# Patient Record
Sex: Male | Born: 1960 | Race: White | Hispanic: No | Smoking: Current every day smoker
Health system: Southern US, Community
[De-identification: ages and names within clinical notes are randomized; demographics above are authoritative.]

## PROBLEM LIST (undated history)

## (undated) DIAGNOSIS — N390 Urinary tract infection, site not specified: Secondary | ICD-10-CM

## (undated) DIAGNOSIS — J449 Chronic obstructive pulmonary disease, unspecified: Secondary | ICD-10-CM

## (undated) DIAGNOSIS — I10 Essential (primary) hypertension: Secondary | ICD-10-CM

## (undated) DIAGNOSIS — Q600 Renal agenesis, unilateral: Secondary | ICD-10-CM

## (undated) DIAGNOSIS — B9620 Unspecified Escherichia coli [E. coli] as the cause of diseases classified elsewhere: Secondary | ICD-10-CM

## (undated) DIAGNOSIS — A4150 Gram-negative sepsis, unspecified: Secondary | ICD-10-CM

## (undated) DIAGNOSIS — J40 Bronchitis, not specified as acute or chronic: Secondary | ICD-10-CM

## (undated) DIAGNOSIS — Z720 Tobacco use: Secondary | ICD-10-CM

## (undated) DIAGNOSIS — R7881 Bacteremia: Secondary | ICD-10-CM

## (undated) HISTORY — PX: INNER EAR SURGERY: SHX679

## (undated) HISTORY — PX: HAND SURGERY: SHX662

---

## 2005-01-26 ENCOUNTER — Emergency Department: Payer: Self-pay | Admitting: Emergency Medicine

## 2012-02-21 ENCOUNTER — Observation Stay: Payer: Self-pay | Admitting: Internal Medicine

## 2012-02-21 LAB — CBC WITH DIFFERENTIAL/PLATELET
Basophil #: 0.1 10*3/uL (ref 0.0–0.1)
Basophil %: 0.4 %
Eosinophil %: 0.2 %
HGB: 14.5 g/dL (ref 13.0–18.0)
Lymphocyte %: 6.2 %
MCH: 32 pg (ref 26.0–34.0)
MCHC: 33.3 g/dL (ref 32.0–36.0)
MCV: 96 fL (ref 80–100)
Monocyte #: 1.5 x10 3/mm — ABNORMAL HIGH (ref 0.2–1.0)
Monocyte %: 8.8 %
Platelet: 271 10*3/uL (ref 150–440)
RBC: 4.52 10*6/uL (ref 4.40–5.90)
RDW: 13.9 % (ref 11.5–14.5)
WBC: 17.2 10*3/uL — ABNORMAL HIGH (ref 3.8–10.6)

## 2012-02-21 LAB — CK TOTAL AND CKMB (NOT AT ARMC)
CK, Total: 130 U/L (ref 35–232)
CK-MB: 1.5 ng/mL (ref 0.5–3.6)
CK-MB: 0.8 ng/mL (ref 0.5–3.6)

## 2012-02-21 LAB — BASIC METABOLIC PANEL
BUN: 10 mg/dL (ref 7–18)
Calcium, Total: 8.7 mg/dL (ref 8.5–10.1)
EGFR (African American): >60
Glucose: 118 mg/dL — ABNORMAL HIGH (ref 65–99)
Osmolality: 278 (ref 275–301)
Potassium: 3.5 mmol/L (ref 3.5–5.1)

## 2012-02-21 LAB — TROPONIN I: Troponin-I: <0.02 ng/mL

## 2012-02-22 LAB — HEMOGLOBIN A1C: Hemoglobin A1C: 5.3 % (ref 4.2–6.3)

## 2012-02-22 LAB — LIPID PANEL
HDL Cholesterol: 46 mg/dL (ref 40–60)
Ldl Cholesterol, Calc: 71 mg/dL (ref 0–100)
Triglycerides: 54 mg/dL (ref 0–200)

## 2012-02-22 LAB — CK TOTAL AND CKMB (NOT AT ARMC): CK, Total: 97 U/L (ref 35–232)

## 2012-12-24 ENCOUNTER — Emergency Department: Payer: Self-pay | Admitting: Internal Medicine

## 2012-12-24 LAB — CBC WITH DIFFERENTIAL/PLATELET
Basophil #: 0 10*3/uL (ref 0.0–0.1)
Basophil %: 0.5 %
Eosinophil #: 0 10*3/uL (ref 0.0–0.7)
Eosinophil %: 0.2 %
HGB: 15.8 g/dL (ref 13.0–18.0)
Lymphocyte #: 1 10*3/uL (ref 1.0–3.6)
Lymphocyte %: 15.5 %
MCH: 32.4 pg (ref 26.0–34.0)
MCHC: 35 g/dL (ref 32.0–36.0)
Monocyte %: 19.3 %
Neutrophil #: 4.2 10*3/uL (ref 1.4–6.5)
RBC: 4.88 10*6/uL (ref 4.40–5.90)
RDW: 13.5 % (ref 11.5–14.5)

## 2012-12-24 LAB — URINALYSIS, COMPLETE
Bilirubin,UR: NEGATIVE
Ketone: NEGATIVE
Leukocyte Esterase: NEGATIVE
Nitrite: NEGATIVE
Ph: 5 (ref 4.5–8.0)
RBC,UR: 1 /HPF (ref 0–5)
Specific Gravity: 1.018 (ref 1.003–1.030)
Squamous Epithelial: <1
WBC UR: 5 /HPF (ref 0–5)

## 2012-12-24 LAB — BASIC METABOLIC PANEL
Anion Gap: 7 (ref 7–16)
BUN: 12 mg/dL (ref 7–18)
Calcium, Total: 9.4 mg/dL (ref 8.5–10.1)
Chloride: 103 mmol/L (ref 98–107)
EGFR (Non-African Amer.): >60
Glucose: 106 mg/dL — ABNORMAL HIGH (ref 65–99)
Potassium: 3.4 mmol/L — ABNORMAL LOW (ref 3.5–5.1)

## 2012-12-26 LAB — URINE CULTURE

## 2014-08-19 NOTE — H&P (Signed)
PATIENT NAME:  Willie White, Willie White MR#:  [id number] DATE OF BIRTH:  1961/03/17  DATE OF ADMISSION:  02/21/2012  PRIMARY CARE PHYSICIAN: None ER PHYSICIAN: Dr. Ulice Brilliant  CHIEF COMPLAINT: Chest pain.   HISTORY OF PRESENT ILLNESS: 54 year old male with history gastroesophageal reflux disease, takes Zantac occasionally with no other medical problems came in because he had chest pain this morning. Chest pain was in the middle of the chest, like  elephant sitting  on the chest. Patient did not have any shortness of breath. No nausea. No vomiting. Pain was around 8/10 in severity. Patient says that the pain was radiating to the left arm and relieved partly with nitroglycerin. Patient also says the pain is more when he takes deep breath. Denies cough or fever. No associated symptoms of nausea, vomiting or dizziness or sweating and no syncopal episodes.   PAST MEDICAL HISTORY: No hypertension. No diabetes. History of smoking.   ALLERGIES: Allergic to codeine.   SOCIAL HISTORY: Smokes 1 pack per day since smoking. Occasional alcohol. No drugs. Patient works as a Development worker, community.    PAST SURGICAL HISTORY: None.   FAMILY HISTORY: No history of coronary artery disease.   MEDICATIONS: Takes occasional Zantac.   REVIEW OF SYSTEMS: CONSTITUTIONAL: Patient denies any weakness or fatigue. HEENT: No headache. No bleeding from the nose or mouth. No trouble swallowing. CARDIOVASCULAR: Chest pain this morning. PULMONARY: Complains of pain more when he takes deep breath, but no cough, no shortness of breath on exertion. GASTROINTESTINAL: Has no nausea, no vomiting, no abdominal pain. GENITOURINARY: No dysuria. ENDOCRINE: Patient denies any diabetes or thyroid problems. NEUROLOGIC: No strokes. No transient ischemic attacks. No seizures. PSYCHIATRIC: Has no anxiety or insomnia.   PHYSICAL EXAMINATION:  VITAL SIGNS: Temperature 97.3, pulse 77, respirations 17, blood pressure 150/82, saturation 94% on room air.   GENERAL: Alert,  awake, oriented, not in distress.   HEENT: Head atraumatic, normocephalic. Pupils equally reacting to light. Extraocular movements intact.   ENT: No tympanic membrane congestion. No turbinate hypertrophy. No oropharyngeal erythema.   NECK: Normal range of motion. No JVD. No carotid bruit.   CARDIOVASCULAR: S1, S2 regular. No murmurs. PMI not displaced. Pulses are equal bilaterally in femoral and dorsalis pedis.    LUNGS: Clear to auscultation. Not using accessory muscles of respiration.   ABDOMEN: Soft, nontender, nondistended. No hernias. No organomegaly.   SKIN: Warm and dry.   EXTREMITIES: No extremity edema. No cyanosis. No clubbing.   NEUROLOGICAL: Cranial nerves II through XII intact. Power 5/5 in upper and lower extremities. Sensations are intact. Deep tendon reflexes 2+ bilaterally.   LABORATORY, DIAGNOSTIC AND RADIOLOGICAL DATA: Electrolytes: Sodium 113, potassium 3.5, chloride 105, bicarbonate 25, BUN 10, creatinine 0.85, glucose 118, troponin less than 0.02. D-dimer 0.35. WBC slightly elevated at 17.2, hemoglobin 14.5, hematocrit 43.4, platelets 279. CK total 153, CPK-MB 1.5.   Chest x-ray shows no definite evidence of pneumonia but cannot exclude perihilar subsegmental atelectasis. There is a foreign body in the AP window. EKG shows normal sinus rhythm with no ST-T changes.   ASSESSMENT AND PLAN:  1. Patient is a 54 year old male with chest pain relieved with nitroglycerin admitted to hospitalist services on overnight observation. Troponins have been negative. EKG no changes. So get a stress test tomorrow. Continue aspirin, beta blockers. Check fasting lipase. Continue the nitro paste and Lovenox. Patient also on small dose of beta blockers.  2. Possible bronchitis with leukocytosis and painful breathing. Patient is already started on Rocephin and Zithromax. Check the CBC  in the morning.  3. Tobacco abuse. Counseled against smoking. Patient does not want to quit at this time.  Offered nicotine patch 21 mg daily.   TIME SPENT ON HISTORY AND PHYSICAL: About 55 minutes.  ____________________________ Epifanio Lesches, MD sk:cms D: 02/21/2012 14:17:09 ET T: 02/21/2012 14:33:47 ET JOB#: [id number]  cc: Epifanio Lesches, MD, <Dictator> Epifanio Lesches MD ELECTRONICALLY SIGNED 03/13/2012 19:33

## 2014-08-19 NOTE — Discharge Summary (Signed)
PATIENT NAME:  Willie White, COUDRIET MR#:  [id number] DATE OF BIRTH:  06-May-1960  DATE OF ADMISSION:  02/21/2012 DATE OF DISCHARGE:  02/22/2012   DISCHARGE DIAGNOSES:  1. Noncardiac chest pain, likely secondary to bronchitis.  2. Gastroesophageal reflux disease. The patient is waiting for stress test and if results are negative will discharge the patient home.    PRIMARY CARE PHYSICIAN: None.   CONSULTATIONS: None.   HOSPITAL COURSE: The patient is a 54 year old male with no past medical history who came in because of chest pain in the middle of the chest as if an elephant was sitting on his chest. The patient also felt chest hurt more when he takes a deep breath. No cough. No fever. Look at the history and physical for full details. He was admitted for chest pain evaluation and started on nitro patch, aspirin, and small dose beta-blockers. His white count was elevated up to 17.2 on admission. He was started on Rocephin and Zithromax because of possible bronchitis and the fact that he had painful breathing. CK total 153, CPK-MB 1.5. Troponin was less than 0.02 x3. Chest x-ray showed no definite pneumonia, cannot exclude perihilar subsegmental atelectasis. The patient's LDL is 71, triglycerides 54. Hemoglobin A1c 5.3. The patient had good improvement with antibiotics. He says he is able to take deep breaths without now much pain in the chest and I wrote prescription for Augmentin for seven days and also if stress test is negative he can go home and use  nitro SL  as needed. The patient was advised to quit smoking. He is a smoker of one pack per day. He got nicotine patch here.   TIME SPENT ON DISCHARGE PREPARATION: Less than 30 minutes. He will go home if the stress test is negative.   ____________________________ Epifanio Lesches, MD sk:drc D: 02/22/2012 13:54:03 ET T: 02/22/2012 14:06:47 ET JOB#: [id number]  cc: Epifanio Lesches, MD, <Dictator> Epifanio Lesches MD ELECTRONICALLY SIGNED  03/13/2012 15:47

## 2017-03-03 ENCOUNTER — Emergency Department: Payer: Self-pay

## 2017-03-03 ENCOUNTER — Encounter: Payer: Self-pay | Admitting: Emergency Medicine

## 2017-03-03 ENCOUNTER — Emergency Department
Admission: EM | Admit: 2017-03-03 | Discharge: 2017-03-03 | Disposition: A | Discharge status: Home / Self Care | Payer: Self-pay | Attending: Emergency Medicine | Admitting: Emergency Medicine

## 2017-03-03 DIAGNOSIS — I10 Essential (primary) hypertension: Secondary | ICD-10-CM | POA: Insufficient documentation

## 2017-03-03 DIAGNOSIS — F17210 Nicotine dependence, cigarettes, uncomplicated: Secondary | ICD-10-CM | POA: Insufficient documentation

## 2017-03-03 DIAGNOSIS — J441 Chronic obstructive pulmonary disease with (acute) exacerbation: Secondary | ICD-10-CM | POA: Insufficient documentation

## 2017-03-03 HISTORY — DX: Essential (primary) hypertension: I10

## 2017-03-03 LAB — CBC
HEMATOCRIT: 42.3 % (ref 40.0–52.0)
HEMOGLOBIN: 14.4 g/dL (ref 13.0–18.0)
MCH: 32.6 pg (ref 26.0–34.0)
MCHC: 34.1 g/dL (ref 32.0–36.0)
MCV: 95.7 fL (ref 80.0–100.0)
Platelets: 211 10*3/uL (ref 150–440)
RBC: 4.41 MIL/uL (ref 4.40–5.90)
RDW: 14.3 % (ref 11.5–14.5)
WBC: 7.1 10*3/uL (ref 3.8–10.6)

## 2017-03-03 LAB — BASIC METABOLIC PANEL
ANION GAP: 8 (ref 5–15)
BUN: 14 mg/dL (ref 6–20)
CALCIUM: 9.6 mg/dL (ref 8.9–10.3)
CO2: 27 mmol/L (ref 22–32)
Chloride: 106 mmol/L (ref 101–111)
Creatinine, Ser: 0.91 mg/dL (ref 0.61–1.24)
GLUCOSE: 108 mg/dL — AB (ref 65–99)
POTASSIUM: 3.5 mmol/L (ref 3.5–5.1)
Sodium: 141 mmol/L (ref 135–145)

## 2017-03-03 LAB — TROPONIN I

## 2017-03-03 MED ORDER — PREDNISONE 20 MG PO TABS
ORAL_TABLET | ORAL | Status: AC
  Administered 2017-03-03: 60 mg via ORAL
  Filled 2017-03-03: qty 3

## 2017-03-03 MED ORDER — IPRATROPIUM-ALBUTEROL 0.5-2.5 (3) MG/3ML IN SOLN
RESPIRATORY_TRACT | Status: AC
  Administered 2017-03-03: 3 mL via RESPIRATORY_TRACT
  Filled 2017-03-03: qty 3

## 2017-03-03 MED ORDER — IPRATROPIUM-ALBUTEROL 0.5-2.5 (3) MG/3ML IN SOLN
3.0000 mL | Freq: Once | RESPIRATORY_TRACT | Status: AC
  Administered 2017-03-03: 3 mL via RESPIRATORY_TRACT
  Filled 2017-03-03 (×2): qty 3

## 2017-03-03 MED ORDER — ALBUTEROL SULFATE HFA 108 (90 BASE) MCG/ACT IN AERS: 2.0000 | INHALATION_SPRAY | Freq: Four times a day (QID) | RESPIRATORY_TRACT | 2 refills | Status: DC | PRN

## 2017-03-03 MED ORDER — IPRATROPIUM-ALBUTEROL 0.5-2.5 (3) MG/3ML IN SOLN
3.0000 mL | Freq: Once | RESPIRATORY_TRACT | Status: AC
  Administered 2017-03-03: 3 mL via RESPIRATORY_TRACT
  Filled 2017-03-03: qty 3

## 2017-03-03 MED ORDER — PREDNISONE 20 MG PO TABS
60.0000 mg | ORAL_TABLET | Freq: Once | ORAL | Status: AC
Start: 2017-03-03 — End: 2017-03-03
  Administered 2017-03-03: 60 mg via ORAL

## 2017-03-03 MED ORDER — PREDNISONE 20 MG PO TABS: 60.0000 mg | ORAL_TABLET | Freq: Every day | ORAL | 0 refills | Status: AC

## 2017-03-03 NOTE — Discharge Instructions (Signed)

## 2017-03-03 NOTE — ED Triage Notes (Signed)
Pt in via POV with complaints of generalized chest pain and shortness of breath x approximately one week.  Pt denies any other symptoms.  Pt reports being under a lot of stress at work, denies any hx of anxiety.  NAD noted at this time.

## 2017-03-03 NOTE — ED Provider Notes (Signed)
[facility] Emergency Department Provider Note  ____________________________________________  Time seen: Approximately 8:11 PM  I have reviewed the triage vital signs and the nursing notes.   HISTORY  Chief Complaint Chest Pain   HPI Willie White is a 56 y.o. male a history of smoking hypertension who presents for evaluation of chest pain.Patient reports that his symptom has been ongoing for about a week. He is complaining of chest tightness that is intermittent, diffuse across his chest and usually worse when he is working. 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 Also has had shortness of breath associated with it. Patient has a long history of smoking. Has not seen a physician in several years. No personal or family history of heart attacks or blood clots, no recent travel immobilization, no leg pain or swelling, no hemoptysis, no diaphoresis, no nausea or vomiting. No fever, no cough or congestion.   Past Medical History:  Diagnosis Date  . Hypertension     There are no active problems to display for this patient.   Past Surgical History:  Procedure Laterality Date  . HAND SURGERY Left   . INNER EAR SURGERY Left     Prior to Admission medications   Medication Sig Start Date End Date Taking? Authorizing Provider  albuterol (PROVENTIL HFA;VENTOLIN HFA) 108 (90 Base) MCG/ACT inhaler Inhale 2 puffs into the lungs every 6 (six) hours as needed for wheezing or shortness of breath. 03/03/17   Rudene Re, MD  predniSONE (DELTASONE) 20 MG tablet Take 3 tablets (60 mg total) by mouth daily. 03/03/17 03/07/17  Rudene Re, MD    Allergies Codeine  No family history on file.  Social History Social History  Substance Use Topics  . Smoking status: Current Every Day Smoker    Packs/day: 1.00    Types: Cigarettes  . Smokeless tobacco: Never Used  . Alcohol use Yes     Comment: Occassional     Review of  Systems  Constitutional: Negative for fever. Eyes: Negative for visual changes. ENT: Negative for sore throat. Neck: No neck pain  Cardiovascular: + Chest tightness  Respiratory: + shortness of breath. Gastrointestinal: Negative for abdominal pain, vomiting or diarrhea. Genitourinary: Negative for dysuria. Musculoskeletal: Negative for back pain. Skin: Negative for rash. Neurological: Negative for headaches, weakness or numbness. Psych: No SI or HI  ____________________________________________   PHYSICAL EXAM:  VITAL SIGNS: ED Triage Vitals [03/03/17 1805]  Enc Vitals Group     BP (!) 162/93     Pulse Rate 70     Resp 20     Temp 98.6 F (37 C)     Temp Source Oral     SpO2 97 %     Weight 160 lb (72.6 kg)     Height 5\' 8"  (1.727 m)     Head Circumference      Peak Flow      Pain Score 7     Pain Loc      Pain Edu?      Excl. in [place]?     Constitutional: Alert and oriented. Well appearing and in no apparent distress. HEENT:      Head: Normocephalic and atraumatic.         Eyes: Conjunctivae are normal. Sclera is non-icteric.       Mouth/Throat: Mucous membranes are moist.       Neck: Supple with no signs of meningismus. Cardiovascular: Regular rate and rhythm. No murmurs,  gallops, or rubs. 2+ symmetrical distal pulses are present in all extremities. No JVD. Respiratory: Normal respiratory effort. Severely diminished air movement with no wheezing or crackles  Gastrointestinal: Soft, non tender, and non distended with positive bowel sounds. No rebound or guarding. Musculoskeletal: Nontender with normal range of motion in all extremities. No edema, cyanosis, or erythema of extremities. Neurologic: Normal speech and language. Face is symmetric. Moving all extremities. No gross focal neurologic deficits are appreciated. Skin: Skin is warm, dry and intact. No rash noted. Psychiatric: Mood and affect are normal. Speech and behavior are  normal.  ____________________________________________   LABS (all labs ordered are listed, but only abnormal results are displayed)  Labs Reviewed  BASIC METABOLIC PANEL - Abnormal; Notable for the following:       Result Value   Glucose, Bld 108 (*)    All other components within normal limits  CBC  TROPONIN I  TROPONIN I   ____________________________________________  EKG  ED ECG REPORT I, Rudene Re, the attending physician, personally viewed and interpreted this ECG.  Normal sinus rhythm, rate of 75, LAFB, normal intervals, left axis deviation, no ST elevations or depressions. Unchanged from prior from 2013. ____________________________________________  RADIOLOGY  CXR:  No focal consolidation. Findings may represent reactive small airway disease versus viral infection. Clinical correlation is recommended. ____________________________________________   PROCEDURES  Procedure(s) performed: None Procedures Critical Care performed:  None ____________________________________________   INITIAL IMPRESSION / ASSESSMENT AND PLAN / ED COURSE  56 y.o. male a history of smoking hypertension who presents for evaluation of chest tightness and SOB x 1 week. Currently exposed to mold at work. History of smoking. Not seen a doctor in several years. Presentation concerning with COPD exacerbation with significantly reduced air movement. After 1 duoneb patient with increased aeration and now with faint wheezes and improvement of tightness. Will give another duoneb and prednisone. CXR with no PNA or infiltrate.     _________________________ 9:53 PM on 03/03/2017 -----------------------------------------  Patient's symptoms markedly improved after duonebs. Moving great air. Troponin x 2 negative. Patient will be dc home on albuterol inhaler, prednisone, referral to [facility] clinic for PCP. Dicussed harms of smoking and counseling provided. Discussed return precautions. Will dc  home at this time   As part of my medical decision making, I reviewed the following data within the [place] notes reviewed and incorporated, Labs reviewed , EKG interpreted , Radiograph reviewed  Notes from prior ED visits and [place] Controlled Substance Database    Pertinent labs & imaging results that were available during my care of the patient were reviewed by me and considered in my medical decision making (see chart for details).    ____________________________________________   FINAL CLINICAL IMPRESSION(S) / ED DIAGNOSES  Final diagnoses:  COPD exacerbation ([place])      NEW MEDICATIONS STARTED DURING THIS VISIT:  New Prescriptions   ALBUTEROL (PROVENTIL HFA;VENTOLIN HFA) 108 (90 BASE) MCG/ACT INHALER    Inhale 2 puffs into the lungs every 6 (six) hours as needed for wheezing or shortness of breath.   PREDNISONE (DELTASONE) 20 MG TABLET    Take 3 tablets (60 mg total) by mouth daily.     Note:  This document was prepared using Dragon voice recognition software and may include unintentional dictation errors.    Rudene Re, MD 03/03/17 2154

## 2018-05-02 ENCOUNTER — Encounter: Payer: Self-pay | Admitting: Emergency Medicine

## 2018-05-02 ENCOUNTER — Other Ambulatory Visit: Payer: Self-pay

## 2018-05-02 ENCOUNTER — Emergency Department: Payer: Self-pay

## 2018-05-02 ENCOUNTER — Emergency Department
Admission: EM | Admit: 2018-05-02 | Discharge: 2018-05-02 | Disposition: A | Discharge status: Home / Self Care | Payer: Self-pay | Attending: Emergency Medicine | Admitting: Emergency Medicine

## 2018-05-02 DIAGNOSIS — J441 Chronic obstructive pulmonary disease with (acute) exacerbation: Secondary | ICD-10-CM | POA: Insufficient documentation

## 2018-05-02 DIAGNOSIS — I10 Essential (primary) hypertension: Secondary | ICD-10-CM | POA: Insufficient documentation

## 2018-05-02 DIAGNOSIS — F17210 Nicotine dependence, cigarettes, uncomplicated: Secondary | ICD-10-CM | POA: Insufficient documentation

## 2018-05-02 LAB — CBC
HCT: 47.8 % (ref 39.0–52.0)
Hemoglobin: 15.8 g/dL (ref 13.0–17.0)
MCH: 31.9 pg (ref 26.0–34.0)
MCHC: 33.1 g/dL (ref 30.0–36.0)
MCV: 96.4 fL (ref 80.0–100.0)
PLATELETS: 219 10*3/uL (ref 150–400)
RBC: 4.96 MIL/uL (ref 4.22–5.81)
RDW: 13.7 % (ref 11.5–15.5)
WBC: 11.9 10*3/uL — AB (ref 4.0–10.5)
nRBC: 0 % (ref 0.0–0.2)

## 2018-05-02 LAB — BASIC METABOLIC PANEL
Anion gap: 8 (ref 5–15)
BUN: 18 mg/dL (ref 6–20)
CALCIUM: 9 mg/dL (ref 8.9–10.3)
CO2: 24 mmol/L (ref 22–32)
CREATININE: 1.08 mg/dL (ref 0.61–1.24)
Chloride: 107 mmol/L (ref 98–111)
GFR calc Af Amer: >60 mL/min (ref >60)
Glucose, Bld: 126 mg/dL — ABNORMAL HIGH (ref 70–99)
Potassium: 3.7 mmol/L (ref 3.5–5.1)
SODIUM: 139 mmol/L (ref 135–145)

## 2018-05-02 LAB — TROPONIN I

## 2018-05-02 MED ORDER — METHYLPREDNISOLONE SODIUM SUCC 125 MG IJ SOLR
125.0000 mg | Freq: Once | INTRAMUSCULAR | Status: AC
  Administered 2018-05-02: 125 mg via INTRAVENOUS
  Filled 2018-05-02: qty 2

## 2018-05-02 MED ORDER — IPRATROPIUM-ALBUTEROL 0.5-2.5 (3) MG/3ML IN SOLN
3.0000 mL | Freq: Once | RESPIRATORY_TRACT | Status: AC
  Administered 2018-05-02: 3 mL via RESPIRATORY_TRACT
  Filled 2018-05-02: qty 3

## 2018-05-02 MED ORDER — ALBUTEROL SULFATE HFA 108 (90 BASE) MCG/ACT IN AERS: 2.0000 | INHALATION_SPRAY | Freq: Four times a day (QID) | RESPIRATORY_TRACT | 2 refills | Status: DC | PRN

## 2018-05-02 MED ORDER — PREDNISONE 50 MG PO TABS: 50.0000 mg | ORAL_TABLET | Freq: Every day | ORAL | 0 refills | Status: DC

## 2018-05-02 NOTE — ED Triage Notes (Addendum)
Pt to ed with c/o sob x 4 days. Pt states " I just can't get a deep breath"  Pt reports heavy smoking for many years.  Reports hx of COPD.  Pt skin warm and dry.  Pt sats 97% on RA.

## 2018-05-02 NOTE — ED Provider Notes (Signed)
[facility] Emergency Department Provider Note   ____________________________________________    I have reviewed the triage vital signs and the nursing notes.   HISTORY  Chief Complaint Shortness of Breath     HPI Willie White is a 58 y.o. male who presents with complaints of shortness of breath.  Patient reports long history of smoking, he reports chest tightness, unable to get a full breath.  This is been worsening over the last 4 days.  Describes dry cough as well.  No fevers or chills.  No calf pain or swelling.  Has not taken thing for this.   Past Medical History:  Diagnosis Date  . Hypertension     There are no active problems to display for this patient.   Past Surgical History:  Procedure Laterality Date  . HAND SURGERY Left   . INNER EAR SURGERY Left     Prior to Admission medications   Medication Sig Start Date End Date Taking? Authorizing Provider  albuterol (PROVENTIL HFA;VENTOLIN HFA) 108 (90 Base) MCG/ACT inhaler Inhale 2 puffs into the lungs every 6 (six) hours as needed for wheezing or shortness of breath. 05/02/18   Lavonia Drafts, MD  predniSONE (DELTASONE) 50 MG tablet Take 1 tablet (50 mg total) by mouth daily with breakfast. 05/02/18   Lavonia Drafts, MD  ranitidine (ZANTAC) 150 MG tablet Take 150 mg by mouth 2 (two) times daily.    [provider]     Allergies Codeine  No family history on file.  Social History Social History   Tobacco Use  . Smoking status: Current Every Day Smoker    Packs/day: 1.00    Types: Cigarettes  . Smokeless tobacco: Never Used  Substance Use Topics  . Alcohol use: Yes    Comment: Occassional   . Drug use: No    Review of Systems  Constitutional: No fever/chills Eyes: No visual changes.  ENT: No sore throat. Cardiovascular: Denies chest pain. Respiratory: as above Gastrointestinal: No abdominal pain.  No nausea, no vomiting.   Genitourinary: Negative for  dysuria. Musculoskeletal: Negative for back pain. Skin: Negative for rash. Neurological: Negative for headaches or weakness   ____________________________________________   PHYSICAL EXAM:  VITAL SIGNS: ED Triage Vitals  Enc Vitals Group     BP 05/02/18 0953 (!) 158/108     Pulse Rate 05/02/18 0953 84     Resp 05/02/18 0953 18     Temp 05/02/18 0953 98.5 F (36.9 C)     Temp Source 05/02/18 0953 Oral     SpO2 05/02/18 0953 97 %     Weight 05/02/18 0954 72.6 kg (160 lb 0.9 oz)     Height --      Head Circumference --      Peak Flow --      Pain Score 05/02/18 0954 4     Pain Loc --      Pain Edu? --      Excl. in [place]? --     Constitutional: Alert and oriented. Eyes: Conjunctivae are normal.   Nose: No congestion/rhinnorhea. Mouth/Throat: Mucous membranes are moist.    Cardiovascular: Normal rate, regular rhythm. Grossly normal heart sounds.  Good peripheral circulation. Respiratory: Mild tachypnea, scattered wheezes, moderate airflow Gastrointestinal: Soft and nontender. No distention.   Musculoskeletal: No lower extremity tenderness nor edema.  Warm and well perfused Neurologic:  Normal speech and language. No gross focal neurologic deficits are appreciated.  Skin:  Skin is warm, dry and  intact. No rash noted. Psychiatric: Mood and affect are normal. Speech and behavior are normal.  ____________________________________________   LABS (all labs ordered are listed, but only abnormal results are displayed)  Labs Reviewed  BASIC METABOLIC PANEL - Abnormal; Notable for the following components:      Result Value   Glucose, Bld 126 (*)    All other components within normal limits  CBC - Abnormal; Notable for the following components:   WBC 11.9 (*)    All other components within normal limits  TROPONIN I   ____________________________________________  EKG  ED ECG REPORT I, Lavonia Drafts, the attending physician, personally viewed and interpreted this  ECG.  Date: 05/02/2018  Rhythm: normal sinus rhythm QRS Axis: normal Intervals: normal ST/T Wave abnormalities: normal Narrative Interpretation: no evidence of acute ischemia  ____________________________________________  RADIOLOGY  Chest x-ray normal ____________________________________________   PROCEDURES  Procedure(s) performed: No  Procedures   Critical Care performed: No ____________________________________________   INITIAL IMPRESSION / ASSESSMENT AND PLAN / ED COURSE  Pertinent labs & imaging results that were available during my care of the patient were reviewed by me and considered in my medical decision making (see chart for details).  Patient presents with shortness of breath, likely related to COPD exacerbation.  Treated with IV Solu-Medrol, multiple duo nebs.  X-ray negative for pneumonia, lab work reassuring.  On reexam patient moderately improved.  Recommended admission to the patient but he refused states that he has animals to take care of.  He knows he can return anytime.  Will treat with albuterol inhaler and prednisone    ____________________________________________   FINAL CLINICAL IMPRESSION(S) / ED DIAGNOSES  Final diagnoses:  COPD exacerbation ([place])        Note:  This document was prepared using Dragon voice recognition software and may include unintentional dictation errors.   Lavonia Drafts, MD 05/02/18 [phone]

## 2018-05-02 NOTE — ED Notes (Signed)
Pt in NAD at this time. Pt stating the breathing tx have helped. Nurse apologized for the wait. Nurse explained that the doctor should be seeing him momentarily since his results were back.

## 2018-05-02 NOTE — ED Notes (Signed)
Dr. Corky Downs at pt's bedside.

## 2019-09-02 ENCOUNTER — Emergency Department: Payer: Self-pay

## 2019-09-02 ENCOUNTER — Emergency Department
Admission: EM | Admit: 2019-09-02 | Discharge: 2019-09-02 | Disposition: A | Discharge status: Home / Self Care | Payer: Self-pay | Attending: Student in an Organized Health Care Education/Training Program | Admitting: Student in an Organized Health Care Education/Training Program

## 2019-09-02 ENCOUNTER — Encounter: Payer: Self-pay | Admitting: Emergency Medicine

## 2019-09-02 ENCOUNTER — Other Ambulatory Visit: Payer: Self-pay

## 2019-09-02 DIAGNOSIS — K047 Periapical abscess without sinus: Secondary | ICD-10-CM | POA: Insufficient documentation

## 2019-09-02 DIAGNOSIS — L03211 Cellulitis of face: Secondary | ICD-10-CM | POA: Insufficient documentation

## 2019-09-02 DIAGNOSIS — I10 Essential (primary) hypertension: Secondary | ICD-10-CM | POA: Insufficient documentation

## 2019-09-02 DIAGNOSIS — F17210 Nicotine dependence, cigarettes, uncomplicated: Secondary | ICD-10-CM | POA: Insufficient documentation

## 2019-09-02 LAB — CBC WITH DIFFERENTIAL/PLATELET
Abs Immature Granulocytes: 0.02 10*3/uL (ref 0.00–0.07)
Basophils Absolute: 0.1 10*3/uL (ref 0.0–0.1)
Basophils Relative: 1 %
Eosinophils Absolute: 0.3 10*3/uL (ref 0.0–0.5)
Eosinophils Relative: 3 %
HCT: 47.5 % (ref 39.0–52.0)
Hemoglobin: 16.5 g/dL (ref 13.0–17.0)
Immature Granulocytes: 0 %
Lymphocytes Relative: 14 %
Lymphs Abs: 1.1 10*3/uL (ref 0.7–4.0)
MCH: 34 pg (ref 26.0–34.0)
MCHC: 34.7 g/dL (ref 30.0–36.0)
MCV: 97.7 fL (ref 80.0–100.0)
Monocytes Absolute: 1.2 10*3/uL — ABNORMAL HIGH (ref 0.1–1.0)
Monocytes Relative: 14 %
Neutro Abs: 5.8 10*3/uL (ref 1.7–7.7)
Neutrophils Relative %: 68 %
Platelets: 174 10*3/uL (ref 150–400)
RBC: 4.86 MIL/uL (ref 4.22–5.81)
RDW: 13.2 % (ref 11.5–15.5)
WBC: 8.4 10*3/uL (ref 4.0–10.5)
nRBC: 0 % (ref 0.0–0.2)

## 2019-09-02 LAB — COMPREHENSIVE METABOLIC PANEL
ALT: 31 U/L (ref 0–44)
AST: 27 U/L (ref 15–41)
Albumin: 4.3 g/dL (ref 3.5–5.0)
Alkaline Phosphatase: 65 U/L (ref 38–126)
Anion gap: 9 (ref 5–15)
BUN: 12 mg/dL (ref 6–20)
CO2: 25 mmol/L (ref 22–32)
Calcium: 9.3 mg/dL (ref 8.9–10.3)
Chloride: 103 mmol/L (ref 98–111)
Creatinine, Ser: 0.91 mg/dL (ref 0.61–1.24)
GFR calc Af Amer: >60 mL/min (ref >60)
GFR calc non Af Amer: >60 mL/min (ref >60)
Glucose, Bld: 112 mg/dL — ABNORMAL HIGH (ref 70–99)
Potassium: 3.6 mmol/L (ref 3.5–5.1)
Sodium: 137 mmol/L (ref 135–145)
Total Bilirubin: 1.5 mg/dL — ABNORMAL HIGH (ref 0.3–1.2)
Total Protein: 8.4 g/dL — ABNORMAL HIGH (ref 6.5–8.1)

## 2019-09-02 MED ORDER — AMOXICILLIN 875 MG PO TABS
875.0000 mg | ORAL_TABLET | Freq: Two times a day (BID) | ORAL | 0 refills | Status: DC
Start: 2019-09-02 — End: 2019-09-02

## 2019-09-02 MED ORDER — CLINDAMYCIN HCL 150 MG PO CAPS
ORAL_CAPSULE | ORAL | 0 refills | Status: DC
Start: 2019-09-02 — End: 2021-12-16

## 2019-09-02 MED ORDER — SODIUM CHLORIDE 0.9 % IV SOLN
1.0000 g | Freq: Once | INTRAVENOUS | Status: AC
  Administered 2019-09-02: 1 g via INTRAVENOUS
  Filled 2019-09-02: qty 10

## 2019-09-02 MED ORDER — HYDROCODONE-ACETAMINOPHEN 5-325 MG PO TABS: 1.0000 | ORAL_TABLET | Freq: Four times a day (QID) | ORAL | 0 refills | Status: DC | PRN

## 2019-09-02 MED ORDER — IOHEXOL 300 MG/ML  SOLN
75.0000 mL | Freq: Once | INTRAMUSCULAR | Status: AC | PRN
  Administered 2019-09-02: 13:00:00 75 mL via INTRAVENOUS
  Filled 2019-09-02: qty 75

## 2019-09-02 MED ORDER — ALBUTEROL SULFATE HFA 108 (90 BASE) MCG/ACT IN AERS
2.0000 | INHALATION_SPRAY | Freq: Four times a day (QID) | RESPIRATORY_TRACT | 2 refills | Status: DC | PRN
Start: 2019-09-02 — End: 2021-12-16

## 2019-09-02 NOTE — ED Provider Notes (Signed)
[facility] Emergency Department Provider Note   ____________________________________________   First MD Initiated Contact with Patient 09/02/19 1113     (approximate)  I have reviewed the triage vital signs and the nursing notes.   HISTORY  Chief Complaint Facial Swelling   HPI Willie White is a 59 y.o. male presents to the ED with complaint of pain to the right side of his nose that started yesterday.  He states that this morning he woke with swelling around the lower aspect of his right eye and swelling to the right side of his face.  Patient denies any injury, insect bite, recent sinus infection or URI symptoms.  Patient is unaware of any fever denies chills.  There is no dental pain involved.  Currently he rates his pain as 7 out of 10.      Past Medical History:  Diagnosis Date  . Hypertension     There are no problems to display for this patient.   Past Surgical History:  Procedure Laterality Date  . HAND SURGERY Left   . INNER EAR SURGERY Left     Prior to Admission medications   Medication Sig Start Date End Date Taking? Authorizing Provider  albuterol (VENTOLIN HFA) 108 (90 Base) MCG/ACT inhaler Inhale 2 puffs into the lungs every 6 (six) hours as needed for wheezing or shortness of breath. 09/02/19   Johnn Hai, PA-C  clindamycin (CLEOCIN) 150 MG capsule 2 tabs tid x 7 days 09/02/19   Johnn Hai, PA-C  HYDROcodone-acetaminophen (NORCO/VICODIN) 5-325 MG tablet Take 1 tablet by mouth every 6 (six) hours as needed for moderate pain. 09/02/19   Johnn Hai, PA-C    Allergies Codeine  History reviewed. No pertinent family history.  Social History Social History   Tobacco Use  . Smoking status: Current Every Day Smoker    Packs/day: 1.00    Types: Cigarettes  . Smokeless tobacco: Never Used  Substance Use Topics  . Alcohol use: Yes    Comment: Occassional   . Drug use: No    Review of Systems Constitutional:  No fever/chills Eyes: No visual changes. ENT: No sore throat.  Negative for dental pain.  Positive for facial pain. Cardiovascular: Denies chest pain. Respiratory: Denies shortness of breath. Gastrointestinal: No abdominal pain.  No nausea, no vomiting.  Musculoskeletal: Negative for muscle aches. Skin: Erythema and swelling right side of face. Neurological: Negative for headaches, focal weakness or numbness. ____________________________________________   PHYSICAL EXAM:  VITAL SIGNS: ED Triage Vitals  Enc Vitals Group     BP 09/02/19 1058 (!) 156/104     Pulse Rate 09/02/19 1058 92     Resp 09/02/19 1058 20     Temp 09/02/19 1058 98.4 F (36.9 C)     Temp Source 09/02/19 1058 Oral     SpO2 09/02/19 1058 96 %     Weight 09/02/19 1059 190 lb (86.2 kg)     Height 09/02/19 1059 5\' 8"  (1.727 m)     Head Circumference --      Peak Flow --      Pain Score 09/02/19 1105 7     Pain Loc --      Pain Edu? --      Excl. in [place]? --    Constitutional: Alert and oriented. Well appearing and in no acute distress. Eyes: Conjunctivae are normal. PERRL. EOMI. right infraorbital area with soft tissue edema, erythema and tenderness is noted.  No discrete abscess  is palpated. Head: Atraumatic. Nose: No congestion/rhinnorhea.  Mouth/Throat: Mucous membranes are moist.  Oropharynx non-erythematous. Neck: No stridor.   Hematological/Lymphatic/Immunilogical: No cervical lymphadenopathy. Cardiovascular: Normal rate, regular rhythm. Grossly normal heart sounds.  Good peripheral circulation. Respiratory: Normal respiratory effort.  No retractions. Lungs CTAB. Musculoskeletal: Moves upper and lower extremities any difficulty.  Normal gait was noted. Neurologic:  Normal speech and language. No gross focal neurologic deficits are appreciated.  Skin:  Skin is warm, dry and intact. No rash noted. Psychiatric: Mood and affect are normal. Speech and behavior are  normal.  ____________________________________________   LABS (all labs ordered are listed, but only abnormal results are displayed)  Labs Reviewed  CBC WITH DIFFERENTIAL/PLATELET - Abnormal; Notable for the following components:      Result Value   Monocytes Absolute 1.2 (*)    All other components within normal limits  COMPREHENSIVE METABOLIC PANEL - Abnormal; Notable for the following components:   Glucose, Bld 112 (*)    Total Protein 8.4 (*)    Total Bilirubin 1.5 (*)    All other components within normal limits    RADIOLOGY   Official radiology report(s): CT Maxillofacial W Contrast  Result Date: 09/02/2019 CLINICAL DATA:  Right-sided infraorbital cellulitis with tenderness on palpation; mass, lump or swelling, max face. Additional history provided: Right maxillary and eye swelling since 9 p.m. last night, nasal and maxillary pain. EXAM: CT MAXILLOFACIAL WITH CONTRAST TECHNIQUE: Multidetector CT imaging of the maxillofacial structures was performed with intravenous contrast. Multiplanar CT image reconstructions were also generated. CONTRAST:  57mL OMNIPAQUE IOHEXOL 300 MG/ML  SOLN COMPARISON:  No pertinent prior studies available for comparison. FINDINGS: Osseous: Poor dentition with multiple absent, carious and fractured teeth. Most notably in the region of concern, there is periapical lucency surrounding the right maxillary canine and right maxillary first premolar. There may also be subtle periapical lucency surrounding the right maxillary second premolar. No maxillofacial fracture. There is subtle cortical breakthrough along the anterior margin along the right maxillary canine. 2 cm lucent osseous lesion within the lateral right orbital wall with central sclerosis, well circumscribed and favored benign (series 7, image 17). Orbits: Right periorbital inflammatory stranding as described. No CT evidence of postseptal orbital extension at this time. The globes are normal in size and  contour. The extraocular muscles are symmetric and unremarkable. Sinuses: There is opacification of the inferior frontal sinuses and frontal sinus drainage pathways. Mild frontal sinus mucosal thickening more superiorly. Moderate mucosal thickening within anterior ethmoid air cells bilaterally. Mild mucosal thickening within the sphenoid and right maxillary sinuses. No significant mastoid effusion. Soft tissues: There is inflammatory stranding within the right periorbital and right maxillofacial soft tissues, extending to the right perimandibular region. Adjacent to the right maxillary canine tooth, there is a small focus of soft tissue hypodensity measuring 0.5 x 1.1 x 1.4 cm (series 2, image 47) (series 6, image 20). Findings may reflect a small soft tissue abscess versus asymmetric fat. Limited intracranial: No acute abnormality identified. IMPRESSION: Findings consistent with right periorbital and maxillofacial cellulitis extending to the right perimandibular region. The source is not entirely clear as there is both dental and paranasal sinus disease. Most notably in the region of concern, there is periapical lucency surrounding the right maxillary canine and first premolar, as well as possible subtle periapical lucency surrounding the right maxillary second premolar. There is cortical loss anteriorly along the right maxillary canine. An adjacent 1.1 cm soft tissue abscess is questioned versus asymmetric pre-maxillary fat. No CT  evidence of post-septal orbital extension of cellulitis at this time. Electronically Signed   By: Kellie Simmering DO   On: 09/02/2019 13:34    ____________________________________________   PROCEDURES  Procedure(s) performed (including Critical Care):  Procedures   ____________________________________________   INITIAL IMPRESSION / ASSESSMENT AND PLAN / ED COURSE  As part of my medical decision making, I reviewed the following data within the electronic MEDICAL RECORD NUMBER  Notes from prior ED visits and [place] Controlled Substance Database  59 year old male presents to the ED with complaint of right facial swelling and pain over the last 12 hours.  Patient denies any injury to his face.  He is unaware of any fever, chills, nausea or vomiting.  He states that there is no dental pain and that his facial swelling has also decreased his vision in his right eye due to soft tissue edema.  Physical exam is concerning for a periorbital cellulitis.  Lab work was reassuring.  CT scan showed 2 dental abscesses and sinus disease.  Patient was given Rocephin 1 g IV while in the ED.  He was discharged with a prescription for clindamycin, Norco and albuterol inhaler.  Patient is to contact a dentist for his dental abscesses.  His return to the emergency department if any severe worsening of his symptoms or urgent concerns.  Patient is aware that he cannot drive or operate machinery while taking the pain medication.  ____________________________________________   FINAL CLINICAL IMPRESSION(S) / ED DIAGNOSES  Final diagnoses:  Facial cellulitis  Dental abscess     ED Discharge Orders         Ordered    albuterol (VENTOLIN HFA) 108 (90 Base) MCG/ACT inhaler  Every 6 hours PRN     09/02/19 1458    amoxicillin (AMOXIL) 875 MG tablet  2 times daily,   Status:  Discontinued     09/02/19 1458    HYDROcodone-acetaminophen (NORCO/VICODIN) 5-325 MG tablet  Every 6 hours PRN     09/02/19 1458    clindamycin (CLEOCIN) 150 MG capsule    Note to Pharmacy: Please discontinue the RX for Amoxil   09/02/19 1503           Note:  This document was prepared using Dragon voice recognition software and may include unintentional dictation errors.    Johnn Hai, PA-C 09/02/19 1604    Merlyn Lot, MD 09/02/19 [phone]

## 2019-09-02 NOTE — Discharge Instructions (Signed)
Follow-up with your primary care provider or can no clinic acute care if any continued problems.  You also need to follow-up with a dentist for your dental abscesses which is part of why her face is swollen.  Antibiotics were sent to your pharmacy.  Clindamycin 2 tablets 3 times a day for the next 7 days and Norco as needed for pain.  Also the albuterol inhaler prescription was renewed.  Return to the emergency department if any severe worsening of your symptoms such as fever, chills, nausea, vomiting or inability to take the antibiotic.

## 2019-09-02 NOTE — ED Triage Notes (Signed)
States he noticed some pain to right side of nose yesterday  Woke up with right eye and side of face swollen

## 2020-09-30 ENCOUNTER — Other Ambulatory Visit: Payer: Self-pay

## 2020-09-30 ENCOUNTER — Emergency Department: Payer: Self-pay

## 2020-09-30 ENCOUNTER — Encounter: Payer: Self-pay | Admitting: Emergency Medicine

## 2020-09-30 ENCOUNTER — Emergency Department
Admission: EM | Admit: 2020-09-30 | Discharge: 2020-09-30 | Disposition: A | Discharge status: Home / Self Care | Payer: Self-pay | Attending: Emergency Medicine | Admitting: Emergency Medicine

## 2020-09-30 DIAGNOSIS — M791 Myalgia, unspecified site: Secondary | ICD-10-CM | POA: Insufficient documentation

## 2020-09-30 DIAGNOSIS — J441 Chronic obstructive pulmonary disease with (acute) exacerbation: Secondary | ICD-10-CM | POA: Insufficient documentation

## 2020-09-30 DIAGNOSIS — Z20822 Contact with and (suspected) exposure to covid-19: Secondary | ICD-10-CM | POA: Insufficient documentation

## 2020-09-30 DIAGNOSIS — R55 Syncope and collapse: Secondary | ICD-10-CM | POA: Insufficient documentation

## 2020-09-30 DIAGNOSIS — B9689 Other specified bacterial agents as the cause of diseases classified elsewhere: Secondary | ICD-10-CM | POA: Insufficient documentation

## 2020-09-30 DIAGNOSIS — R509 Fever, unspecified: Secondary | ICD-10-CM | POA: Insufficient documentation

## 2020-09-30 DIAGNOSIS — I10 Essential (primary) hypertension: Secondary | ICD-10-CM | POA: Insufficient documentation

## 2020-09-30 DIAGNOSIS — N10 Acute tubulo-interstitial nephritis: Secondary | ICD-10-CM | POA: Insufficient documentation

## 2020-09-30 DIAGNOSIS — R5381 Other malaise: Secondary | ICD-10-CM | POA: Insufficient documentation

## 2020-09-30 DIAGNOSIS — F17210 Nicotine dependence, cigarettes, uncomplicated: Secondary | ICD-10-CM | POA: Insufficient documentation

## 2020-09-30 LAB — BASIC METABOLIC PANEL
Anion gap: 11 (ref 5–15)
BUN: 14 mg/dL (ref 8–23)
CO2: 22 mmol/L (ref 22–32)
Calcium: 9.1 mg/dL (ref 8.9–10.3)
Chloride: 97 mmol/L — ABNORMAL LOW (ref 98–111)
Creatinine, Ser: 1.26 mg/dL — ABNORMAL HIGH (ref 0.61–1.24)
GFR, Estimated: >60 mL/min (ref >60)
Glucose, Bld: 141 mg/dL — ABNORMAL HIGH (ref 70–99)
Potassium: 3.5 mmol/L (ref 3.5–5.1)
Sodium: 130 mmol/L — ABNORMAL LOW (ref 135–145)

## 2020-09-30 LAB — URINALYSIS, COMPLETE (UACMP) WITH MICROSCOPIC
Bilirubin Urine: NEGATIVE
Glucose, UA: NEGATIVE mg/dL
Ketones, ur: NEGATIVE mg/dL
Nitrite: NEGATIVE
Protein, ur: 100 mg/dL — AB
RBC / HPF: >50 RBC/hpf — ABNORMAL HIGH (ref 0–5)
Specific Gravity, Urine: 1.01 (ref 1.005–1.030)
Squamous Epithelial / HPF: NONE SEEN (ref 0–5)
WBC, UA: >50 WBC/hpf — ABNORMAL HIGH (ref 0–5)
pH: 6 (ref 5.0–8.0)

## 2020-09-30 LAB — CBC
HCT: 44.2 % (ref 39.0–52.0)
Hemoglobin: 15.2 g/dL (ref 13.0–17.0)
MCH: 32.5 pg (ref 26.0–34.0)
MCHC: 34.4 g/dL (ref 30.0–36.0)
MCV: 94.6 fL (ref 80.0–100.0)
Platelets: 151 10*3/uL (ref 150–400)
RBC: 4.67 MIL/uL (ref 4.22–5.81)
RDW: 13.4 % (ref 11.5–15.5)
WBC: 14.6 10*3/uL — ABNORMAL HIGH (ref 4.0–10.5)
nRBC: 0 % (ref 0.0–0.2)

## 2020-09-30 LAB — RESP PANEL BY RT-PCR (FLU A&B, COVID) ARPGX2
Influenza A by PCR: NEGATIVE
Influenza B by PCR: NEGATIVE
SARS Coronavirus 2 by RT PCR: NEGATIVE

## 2020-09-30 LAB — LACTIC ACID, PLASMA: Lactic Acid, Venous: 1.2 mmol/L (ref 0.5–1.9)

## 2020-09-30 LAB — CK: Total CK: 81 U/L (ref 49–397)

## 2020-09-30 MED ORDER — IPRATROPIUM-ALBUTEROL 0.5-2.5 (3) MG/3ML IN SOLN
3.0000 mL | Freq: Once | RESPIRATORY_TRACT | Status: AC
  Administered 2020-09-30: 3 mL via RESPIRATORY_TRACT
  Filled 2020-09-30: qty 3

## 2020-09-30 MED ORDER — ACETAMINOPHEN 325 MG PO TABS
650.0000 mg | ORAL_TABLET | Freq: Once | ORAL | Status: AC | PRN
  Administered 2020-09-30: 650 mg via ORAL
  Filled 2020-09-30: qty 2

## 2020-09-30 MED ORDER — PREDNISONE 50 MG PO TABS: 50.0000 mg | ORAL_TABLET | Freq: Every day | ORAL | 0 refills | Status: AC

## 2020-09-30 MED ORDER — METHYLPREDNISOLONE SODIUM SUCC 125 MG IJ SOLR
125.0000 mg | Freq: Once | INTRAMUSCULAR | Status: AC
  Administered 2020-09-30: 125 mg via INTRAVENOUS
  Filled 2020-09-30: qty 2

## 2020-09-30 MED ORDER — SODIUM CHLORIDE 0.9 % IV BOLUS
1000.0000 mL | Freq: Once | INTRAVENOUS | Status: AC
  Administered 2020-09-30: 1000 mL via INTRAVENOUS

## 2020-09-30 MED ORDER — LACTATED RINGERS IV BOLUS
1000.0000 mL | Freq: Once | INTRAVENOUS | Status: AC
  Administered 2020-09-30: 1000 mL via INTRAVENOUS

## 2020-09-30 MED ORDER — ALBUTEROL SULFATE HFA 108 (90 BASE) MCG/ACT IN AERS: 2.0000 | INHALATION_SPRAY | Freq: Four times a day (QID) | RESPIRATORY_TRACT | 2 refills | Status: DC | PRN

## 2020-09-30 MED ORDER — DOXYCYCLINE HYCLATE 100 MG PO TABS
100.0000 mg | ORAL_TABLET | Freq: Once | ORAL | Status: AC
  Administered 2020-09-30: 100 mg via ORAL
  Filled 2020-09-30: qty 1

## 2020-09-30 MED ORDER — CEFDINIR 300 MG PO CAPS: 300.0000 mg | ORAL_CAPSULE | Freq: Two times a day (BID) | ORAL | 0 refills | Status: AC

## 2020-09-30 NOTE — ED Provider Notes (Signed)
[facility] Emergency Department Provider Note ____________________________________________   Event Date/Time   First MD Initiated Contact with Patient 09/30/20 1046     (approximate)  I have reviewed the triage vital signs and the nursing notes.  HISTORY  Chief Complaint Loss of Consciousness and Fever   HPI Willie White is a 60 y.o. malewho presents to the ED for evaluation of syncopal episode and generalized malaise.  Chart review indicates history of hypertension. Patient reports lifelong 1 PPD smoking history.  Patient presents to the ED for evaluation of myalgias, fever and generalized malaise over the past few days.  He reports doing a lot of work in the garden over the weekend, working in the sun 4-5 days ago.  Reports while working outside in the hot day on Sunday, 3 days ago, he began feeling dizzy, lightheaded and had a syncopal episode.  Patient was going inside that evening, and over the past 48 hours since that time, he has felt diffuse myalgias, subjective fevers, generalized malaise and feeling poorly.  He reports increased sputum production from his baseline smoker's cough.  Reports his urine is very dark, but denies dysuria.  Denies stool changes, diarrhea or abdominal pain.  Denies chest pain or shortness of breath, though does report increased sputum production.  No subsequent syncopal episodes, changes to his gait   Past Medical History:  Diagnosis Date  . Hypertension     There are no problems to display for this patient.   Past Surgical History:  Procedure Laterality Date  . HAND SURGERY Left   . INNER EAR SURGERY Left     Prior to Admission medications   Medication Sig Start Date End Date Taking? Authorizing Provider  albuterol (VENTOLIN HFA) 108 (90 Base) MCG/ACT inhaler Inhale 2 puffs into the lungs every 6 (six) hours as needed for wheezing or shortness of breath. 09/30/20  Yes Vladimir Crofts, MD  cefdinir (OMNICEF) 300 MG  capsule Take 1 capsule (300 mg total) by mouth 2 (two) times daily for 7 days. 09/30/20 10/07/20 Yes Vladimir Crofts, MD  predniSONE (DELTASONE) 50 MG tablet Take 1 tablet (50 mg total) by mouth daily for 4 days. 09/30/20 10/04/20 Yes Vladimir Crofts, MD  albuterol (VENTOLIN HFA) 108 (90 Base) MCG/ACT inhaler Inhale 2 puffs into the lungs every 6 (six) hours as needed for wheezing or shortness of breath. 09/02/19   Johnn Hai, PA-C  clindamycin (CLEOCIN) 150 MG capsule 2 tabs tid x 7 days 09/02/19   Johnn Hai, PA-C  HYDROcodone-acetaminophen (NORCO/VICODIN) 5-325 MG tablet Take 1 tablet by mouth every 6 (six) hours as needed for moderate pain. 09/02/19   Johnn Hai, PA-C    Allergies Codeine  History reviewed. No pertinent family history.  Social History Social History   Tobacco Use  . Smoking status: Current Every Day Smoker    Packs/day: 1.00    Types: Cigarettes  . Smokeless tobacco: Never Used  Vaping Use  . Vaping Use: Never used  Substance Use Topics  . Alcohol use: Yes    Comment: Occassional   . Drug use: No    Review of Systems  Constitutional: Positive for subjective fever/chills Eyes: No visual changes. ENT: No sore throat. Cardiovascular: Denies chest pain. Respiratory: Denies shortness of breath.  Positive for productive cough Gastrointestinal: No abdominal pain.  No nausea, no vomiting.  No diarrhea.  No constipation. Genitourinary: Negative for dysuria. Musculoskeletal: Negative for back pain.  Positive for diffuse myalgias Skin: Negative  for rash. Neurological: Negative for headaches, focal weakness or numbness. ____________________________________________   PHYSICAL EXAM:  VITAL SIGNS: Vitals:   09/30/20 1101 09/30/20 1345  BP:  108/66  Pulse:  82  Resp:  (!) 21  Temp: 98.5 F (36.9 C)   SpO2:  96%    Constitutional: Alert and oriented. Well appearing and in no acute distress. Eyes: Conjunctivae are normal. PERRL. EOMI. Head:  Atraumatic. Nose: No congestion/rhinnorhea. Mouth/Throat: Mucous membranes are dry.  Oropharynx non-erythematous. Neck: No stridor. No cervical spine tenderness to palpation. Cardiovascular: Normal rate, regular rhythm. Grossly normal heart sounds.  Good peripheral circulation. Respiratory: Mild tachypnea to the low 20s, no further evidence of distress.  No retractions.  Prolonged expiratory phase and diffuse expiratory wheezes with slight decrease in air movement throughout. Gastrointestinal: Soft , nondistended, nontender to palpation. No CVA tenderness. Musculoskeletal: No lower extremity tenderness nor edema.  No joint effusions. No signs of acute trauma. Neurologic:  Normal speech and language. No gross focal neurologic deficits are appreciated. No gait instability noted. Cranial nerves II through XII intact 5/5 strength and sensation in all 4 extremities Skin:  Skin is warm, dry and intact. No rash noted. Psychiatric: Mood and affect are normal. Speech and behavior are normal. ____________________________________________   LABS (all labs ordered are listed, but only abnormal results are displayed)  Labs Reviewed  BASIC METABOLIC PANEL - Abnormal; Notable for the following components:      Result Value   Sodium 130 (*)    Chloride 97 (*)    Glucose, Bld 141 (*)    Creatinine, Ser 1.26 (*)    All other components within normal limits  CBC - Abnormal; Notable for the following components:   WBC 14.6 (*)    All other components within normal limits  URINALYSIS, COMPLETE (UACMP) WITH MICROSCOPIC - Abnormal; Notable for the following components:   Color, Urine YELLOW (*)    APPearance CLOUDY (*)    Hgb urine dipstick LARGE (*)    Protein, ur 100 (*)    Leukocytes,Ua LARGE (*)    RBC / HPF >50 (*)    WBC, UA >50 (*)    Bacteria, UA FEW (*)    All other components within normal limits  RESP PANEL BY RT-PCR (FLU A&B, COVID) [id number]  URINE CULTURE  LACTIC ACID, PLASMA  CK    ____________________________________________  12 Lead EKG  Sinus rhythm, rate of 91 bpm.  Leftward axis, normal intervals, no STEMI ____________________________________________  RADIOLOGY  ED MD interpretation: 2 view CXR reviewed by me with no lobar infiltration.   Official radiology report(s): DG Chest 2 View  Result Date: 09/30/2020 CLINICAL DATA:  Fever. EXAM: CHEST - 2 VIEW COMPARISON:  05/02/2018.  03/03/2017. FINDINGS: Mediastinum and hilar structures normal. Heart size normal. Questionable nodular opacity noted over the anterior lungs on lateral view. Nonenhanced chest CT suggested for further evaluation. No acute infiltrate noted. No pleural effusion or pneumothorax. Degenerative change thoracic spine. Metallic density again noted over the left chest in unchanged position. IMPRESSION: 1. Questionable nodular opacity noted over the anterior lungs on lateral view. Nonenhanced chest CT suggested for further evaluation. 2.  No acute infiltrate noted. Electronically Signed   By: Marcello Moores  Register   On: 09/30/2020 11:21   CT Chest Wo Contrast  Result Date: 09/30/2020 CLINICAL DATA:  Fever and chills. Nodular density seen on chest x-ray. EXAM: CT CHEST WITHOUT CONTRAST TECHNIQUE: Multidetector CT imaging of the chest was performed following the standard protocol without IV  contrast. COMPARISON:  Chest x-ray from same day. FINDINGS: Cardiovascular: Normal heart size. No pericardial effusion. No thoracic aortic aneurysm. Coronary, aortic arch, and branch vessel atherosclerotic vascular disease. Mediastinum/Nodes: No enlarged mediastinal or axillary lymph nodes. Thyroid gland, trachea, and esophagus demonstrate no significant findings. Lungs/Pleura: Moderate paraseptal emphysema. Minimal secretions in the right mainstem bronchus. No CT correlate for the nodular density seen on chest x-ray. 4 mm nodule in the right upper lobe (series 3, image 61). No focal consolidation, pleural effusion, or  pneumothorax. Upper Abdomen: Diffusely decreased hepatic density. Bilateral low-density adrenal nodules measuring 2.7 cm on the right and 1.8 cm on the left, consistent with adenomas. Punctate right renal calculi. Asymmetric, somewhat enlarged right renal parenchyma anteriorly, incompletely visualized. The superior pole of the left kidney is not visualized. Musculoskeletal: Bilateral gynecomastia. No acute or significant osseous findings. Unchanged intercostal metallic foreign body between the left seventh and eighth ribs. IMPRESSION: 1. No CT correlate for the nodular density seen on chest x-ray. No suspicious pulmonary nodule. 2. 4 mm nodule in the right upper lobe. No follow-up needed if patient is low-risk. Non-contrast chest CT can be considered in 12 months if patient is high-risk. This recommendation follows the consensus statement: Guidelines for Management of Incidental Pulmonary Nodules Detected on CT Images: From the Fleischner Society 2017; Radiology 2017; 284:228-243. 3. Asymmetric, somewhat enlarged right renal parenchyma anteriorly, incompletely visualized. Recommend further evaluation with renal ultrasound to exclude mass lesion. This may represent compensatory hypertrophy as a left kidney is not identified in its expected location, though it may be ectopic and below the field of view. 4. Hepatic steatosis. 5. Bilateral adrenal adenomas. 6. Punctate right nephrolithiasis. 7. Aortic Atherosclerosis (ICD10-I70.0) and Emphysema (ICD10-J43.9). Electronically Signed   By: Titus Dubin M.D.   On: 09/30/2020 12:07   US Renal  Result Date: 09/30/2020 CLINICAL DATA:  Questionable right renal lesion on recent CT EXAM: RENAL / URINARY TRACT ULTRASOUND COMPLETE COMPARISON:  Chest CT including upper abdomen September 30, 2020 FINDINGS: Right Kidney: Renal measurements: 15.3 x 7.9 x 7.6 cm = volume: 480.7 mL. Echogenicity and renal cortical thickness are within normal limits. No perinephric fluid or hydronephrosis  visualized. There is an area of increased echogenicity in the mid right kidney measuring 5.2 x 5.2 x 4.8 cm. There is a calculus along the medial aspect of this apparent mass measuring 1.0 cm. No ureterectasis. Note that there is somewhat increased vascularity throughout the right kidney. The vascularity in the masslike area in the mid right kidney is similar to the remainder of the kidney. Left Kidney: Nonvisualized. Bladder: Appears normal for degree of bladder distention. Note that there is flow from the distal right ureter seen in the bladder. No suggestion of flow from a left kidney seen in the bladder. Other: None. IMPRESSION: 1.  Left kidney not visualized and questionably absent. 2. Suspect a degree of compensatory hypertrophy on the right given rather large right kidney. 3. Area of increased echogenicity in the mid right kidney laterally measuring 5.2 x 5.2 x 4.8 cm. This appearance could be due to infectious etiology or potentially neoplastic etiology. Note that there is somewhat increased vascularity throughout the right kidney which suggests potential diffuse infectious bacterial nephritis. The vascularity of the masslike area in the mid right kidney is similar to vascularity elsewhere in the right kidney. No perinephric fluid or hydronephrosis noted. With respect to the sonographic appearance of the right kidney, correlation with urinalysis is advised. From an imaging standpoint, renal MR pre and  post contrast may be helpful for further assessment, in particular to assess for potential infectious versus neoplastic etiology for the masslike area in the mid right kidney. 4.  1 cm calculus mid right kidney.  No ureterectasis. Electronically Signed   By: Lowella Grip III M.D.   On: 09/30/2020 13:50    ____________________________________________   PROCEDURES and INTERVENTIONS  Procedure(s) performed (including Critical Care):  .1-3 Lead EKG Interpretation Performed by: Vladimir Crofts,  MD Authorized by: Vladimir Crofts, MD     Interpretation: normal     ECG rate:  80   ECG rate assessment: normal     Rhythm: sinus rhythm     Ectopy: none     Conduction: normal      Medications  acetaminophen (TYLENOL) tablet 650 mg (650 mg Oral Given 09/30/20 0858)  sodium chloride 0.9 % bolus 1,000 mL (0 mLs Intravenous Stopped 09/30/20 1330)  lactated ringers bolus 1,000 mL (0 mLs Intravenous Stopped 09/30/20 1330)  ipratropium-albuterol (DUONEB) 0.5-2.5 (3) MG/3ML nebulizer solution 3 mL (3 mLs Nebulization Given 09/30/20 1203)  methylPREDNISolone sodium succinate (SOLU-MEDROL) 125 mg/2 mL injection 125 mg (125 mg Intravenous Given 09/30/20 1204)  doxycycline (VIBRA-TABS) tablet 100 mg (100 mg Oral Given 09/30/20 1204)    ____________________________________________   MDM / ED COURSE   60 year old smoker presents to the ED after syncopal episode while working outside, with evidence of COPD exacerbation and UTI with possible pyelonephritis, ultimately amenable to outpatient management.  Presents with low-grade fever, otherwise hemodynamically stable without hypoxia on room air.  Did have 1 O2 check of 89% while in triage, self resolving upon immediate recheck, and subsequently no hypoxia.  Exam shows some stigmata of COPD exacerbation with expiratory wheezes, decreased air movement throughout in a pink puffer morphology patient.  EKG without interval changes to suggest cardiac syncope, and with preceding dizziness while working outside, I suspect a degree of dehydration and vasovagal syncope.  Initially provided breathing treatments and doxycycline to treat COPD exacerbation with increased sputum production, he reports improved symptoms after this.  Urinalysis returns with some stigmata of infection, and was sent for culture.  CXR without lobar infiltration, and follow-up CT demonstrates no masses or infiltrates.  Renal ultrasound with increased echogenicity to the right kidney, further concerning for  possible pyelonephritis.  No hydronephrosis or evidence of urologic obstruction.  Patient frequently requesting discharge, and I think this is suitable considering how well he looks after his breathing treatments.  Provided Omnicef for UTI and respiratory coverage considering his increased pedal production.  Provided steroid burst and we discussed return precautions for the ED prior to discharge.  Clinical Course as of 09/30/20 1442  Wed Sep 30, 2020  1430 Reassessed.  Discussed overall work-up with the patient my concern for the possibility of COPD exacerbation and UTI, possibly pyelonephritis.  We discussed antibiotic regimen to cover for both, steroids for his lungs.  We discussed return precautions for the ED.  He expresses understanding and agreement, reports he is ready to leave. [DS]  4034 Patient was feeling better after his breathing treatments. [DS]    Clinical Course User Index [DS] Vladimir Crofts, MD    ____________________________________________   FINAL CLINICAL IMPRESSION(S) / ED DIAGNOSES  Final diagnoses:  Syncope and collapse  COPD exacerbation ([place])  Acute pyelonephritis     ED Discharge Orders         Ordered    cefdinir (OMNICEF) 300 MG capsule  2 times daily  09/30/20 1433    predniSONE (DELTASONE) 50 MG tablet  Daily        09/30/20 1433    albuterol (VENTOLIN HFA) 108 (90 Base) MCG/ACT inhaler  Every 6 hours PRN        09/30/20 1435           Kaliope Quinonez   Note:  This document was prepared using Systems analyst and may include unintentional dictation errors.   Vladimir Crofts, MD 09/30/20 [phone]

## 2020-09-30 NOTE — Discharge Instructions (Addendum)
As we discussed, you are being discharged with a prescription for antibiotics and steroids to treat inflammation of the lungs and possible urinary tract infection.  Please pick up these prescriptions later today.  Omnicef antibiotic to take twice daily for the next 7 days, please finish all 14 pills, even if you are feeling better.  Take 1 dose of this this evening, and start twice daily tomorrow.  Prednisone steroids to take once daily for the next 4 days, starting tomorrow.  We are to give you a dose of steroids for today.  Also sent a prescription for a new albuterol inhaler to have on hand in case he needed 1.  Use your albuterol every 4-6 hours, 2 puffs at a time, to help with your breathing.  If you develop any further worsening symptoms despite these measures, please return to the ED.

## 2020-09-30 NOTE — ED Notes (Signed)
Patient transported to X-ray 

## 2020-09-30 NOTE — ED Notes (Signed)
Patient transported to CT 

## 2020-09-30 NOTE — ED Triage Notes (Signed)
Pt comes into the ED via POV c/o syncopal episode on Sunday. Pt states he was working in his yard Sunday when he got really hot and then passed out.  Pt states he hasn't felt well since then presenting with a fever and chills. Pt denies any pain.  Pt denies hitting his head and denies any blood thinner use.  Pt has even and unlabored respirations

## 2020-10-03 LAB — URINE CULTURE: Culture: >=100000 — AB

## 2021-12-15 ENCOUNTER — Inpatient Hospital Stay: Payer: Self-pay | Admitting: Anesthesiology

## 2021-12-15 ENCOUNTER — Emergency Department: Payer: Self-pay

## 2021-12-15 ENCOUNTER — Encounter: Payer: Self-pay | Admitting: Emergency Medicine

## 2021-12-15 ENCOUNTER — Inpatient Hospital Stay: Payer: Self-pay

## 2021-12-15 ENCOUNTER — Other Ambulatory Visit: Payer: Self-pay

## 2021-12-15 ENCOUNTER — Encounter
Admission: EM | Disposition: A | Discharge status: Home / Self Care | Payer: Self-pay | Source: Home / Self Care | Attending: Internal Medicine

## 2021-12-15 ENCOUNTER — Inpatient Hospital Stay
Admission: EM | Admit: 2021-12-15 | Discharge: 2021-12-18 | DRG: 660 | Disposition: A | Discharge status: Home / Self Care | Payer: Self-pay | Attending: Internal Medicine | Admitting: Internal Medicine

## 2021-12-15 DIAGNOSIS — N2889 Other specified disorders of kidney and ureter: Secondary | ICD-10-CM

## 2021-12-15 DIAGNOSIS — N201 Calculus of ureter: Secondary | ICD-10-CM

## 2021-12-15 DIAGNOSIS — N12 Tubulo-interstitial nephritis, not specified as acute or chronic: Secondary | ICD-10-CM

## 2021-12-15 DIAGNOSIS — Z885 Allergy status to narcotic agent status: Secondary | ICD-10-CM

## 2021-12-15 DIAGNOSIS — D3500 Benign neoplasm of unspecified adrenal gland: Secondary | ICD-10-CM | POA: Diagnosis present

## 2021-12-15 DIAGNOSIS — F17210 Nicotine dependence, cigarettes, uncomplicated: Secondary | ICD-10-CM | POA: Diagnosis present

## 2021-12-15 DIAGNOSIS — N136 Pyonephrosis: Principal | ICD-10-CM | POA: Diagnosis present

## 2021-12-15 DIAGNOSIS — I10 Essential (primary) hypertension: Secondary | ICD-10-CM | POA: Diagnosis present

## 2021-12-15 DIAGNOSIS — N179 Acute kidney failure, unspecified: Secondary | ICD-10-CM | POA: Diagnosis not present

## 2021-12-15 DIAGNOSIS — B9620 Unspecified Escherichia coli [E. coli] as the cause of diseases classified elsewhere: Secondary | ICD-10-CM | POA: Diagnosis present

## 2021-12-15 DIAGNOSIS — R0902 Hypoxemia: Secondary | ICD-10-CM | POA: Diagnosis not present

## 2021-12-15 DIAGNOSIS — I2510 Atherosclerotic heart disease of native coronary artery without angina pectoris: Secondary | ICD-10-CM | POA: Diagnosis present

## 2021-12-15 DIAGNOSIS — Z20822 Contact with and (suspected) exposure to covid-19: Secondary | ICD-10-CM | POA: Diagnosis present

## 2021-12-15 DIAGNOSIS — J209 Acute bronchitis, unspecified: Secondary | ICD-10-CM | POA: Diagnosis not present

## 2021-12-15 DIAGNOSIS — N139 Obstructive and reflux uropathy, unspecified: Secondary | ICD-10-CM

## 2021-12-15 DIAGNOSIS — J449 Chronic obstructive pulmonary disease, unspecified: Secondary | ICD-10-CM

## 2021-12-15 DIAGNOSIS — Z79899 Other long term (current) drug therapy: Secondary | ICD-10-CM

## 2021-12-15 DIAGNOSIS — Q600 Renal agenesis, unilateral: Secondary | ICD-10-CM

## 2021-12-15 DIAGNOSIS — N433 Hydrocele, unspecified: Secondary | ICD-10-CM | POA: Diagnosis present

## 2021-12-15 DIAGNOSIS — Q602 Renal agenesis, unspecified: Secondary | ICD-10-CM

## 2021-12-15 DIAGNOSIS — N200 Calculus of kidney: Secondary | ICD-10-CM

## 2021-12-15 DIAGNOSIS — N401 Enlarged prostate with lower urinary tract symptoms: Secondary | ICD-10-CM | POA: Diagnosis present

## 2021-12-15 DIAGNOSIS — R7881 Bacteremia: Secondary | ICD-10-CM | POA: Diagnosis present

## 2021-12-15 HISTORY — PX: CYSTOSCOPY WITH URETEROSCOPY AND STENT PLACEMENT: SHX6377

## 2021-12-15 HISTORY — DX: Chronic obstructive pulmonary disease, unspecified: J44.9

## 2021-12-15 LAB — BLOOD CULTURE ID PANEL (REFLEXED) - BCID2

## 2021-12-15 LAB — COMPREHENSIVE METABOLIC PANEL
ALT: 23 U/L (ref 0–44)
AST: 23 U/L (ref 15–41)
Albumin: 3.7 g/dL (ref 3.5–5.0)
Alkaline Phosphatase: 60 U/L (ref 38–126)
Anion gap: 8 (ref 5–15)
BUN: 21 mg/dL (ref 8–23)
CO2: 27 mmol/L (ref 22–32)
Calcium: 9 mg/dL (ref 8.9–10.3)
Chloride: 104 mmol/L (ref 98–111)
Creatinine, Ser: 1.39 mg/dL — ABNORMAL HIGH (ref 0.61–1.24)
GFR, Estimated: 57 mL/min — ABNORMAL LOW (ref >60)
Glucose, Bld: 156 mg/dL — ABNORMAL HIGH (ref 70–99)
Potassium: 3.6 mmol/L (ref 3.5–5.1)
Sodium: 139 mmol/L (ref 135–145)
Total Bilirubin: 0.3 mg/dL (ref 0.3–1.2)
Total Protein: 8.3 g/dL — ABNORMAL HIGH (ref 6.5–8.1)

## 2021-12-15 LAB — URINALYSIS, ROUTINE W REFLEX MICROSCOPIC
Bilirubin Urine: NEGATIVE
Glucose, UA: NEGATIVE mg/dL
Ketones, ur: NEGATIVE mg/dL
Nitrite: NEGATIVE
Protein, ur: 100 mg/dL — AB
RBC / HPF: >50 RBC/hpf — ABNORMAL HIGH (ref 0–5)
Specific Gravity, Urine: 1.011 (ref 1.005–1.030)
Squamous Epithelial / HPF: NONE SEEN (ref 0–5)
WBC, UA: >50 WBC/hpf — ABNORMAL HIGH (ref 0–5)
pH: 8 (ref 5.0–8.0)

## 2021-12-15 LAB — CBC
HCT: 43 % (ref 39.0–52.0)
Hemoglobin: 13.9 g/dL (ref 13.0–17.0)
MCH: 31.5 pg (ref 26.0–34.0)
MCHC: 32.3 g/dL (ref 30.0–36.0)
MCV: 97.5 fL (ref 80.0–100.0)
Platelets: 494 10*3/uL — ABNORMAL HIGH (ref 150–400)
RBC: 4.41 MIL/uL (ref 4.22–5.81)
RDW: 12.9 % (ref 11.5–15.5)
WBC: 16.9 10*3/uL — ABNORMAL HIGH (ref 4.0–10.5)
nRBC: 0 % (ref 0.0–0.2)

## 2021-12-15 LAB — PROCALCITONIN: Procalcitonin: <0.1 ng/mL

## 2021-12-15 LAB — LIPASE, BLOOD: Lipase: 42 U/L (ref 11–51)

## 2021-12-15 LAB — HIV ANTIBODY (ROUTINE TESTING W REFLEX): HIV Screen 4th Generation wRfx: NONREACTIVE

## 2021-12-15 LAB — LACTIC ACID, PLASMA
Lactic Acid, Venous: 3.2 mmol/L (ref 0.5–1.9)
Lactic Acid, Venous: 3.8 mmol/L (ref 0.5–1.9)

## 2021-12-15 SURGERY — CYSTOURETEROSCOPY, WITH STENT INSERTION
Anesthesia: General | Laterality: Right

## 2021-12-15 MED ORDER — FENTANYL CITRATE (PF) 100 MCG/2ML IJ SOLN
25.0000 ug | INTRAMUSCULAR | Status: DC | PRN
  Administered 2021-12-15: 25 ug via INTRAVENOUS

## 2021-12-15 MED ORDER — LACTATED RINGERS IV BOLUS
2000.0000 mL | Freq: Once | INTRAVENOUS | Status: AC
  Administered 2021-12-15: 2000 mL via INTRAVENOUS

## 2021-12-15 MED ORDER — OXYCODONE HCL 5 MG/5ML PO SOLN: 5.0000 mg | Freq: Once | ORAL | Status: DC | PRN

## 2021-12-15 MED ORDER — SODIUM CHLORIDE 0.9 % IV SOLN: INTRAVENOUS | Status: DC

## 2021-12-15 MED ORDER — FENTANYL CITRATE (PF) 100 MCG/2ML IJ SOLN
INTRAMUSCULAR | Status: DC | PRN
  Administered 2021-12-15 (×3): 50 ug via INTRAVENOUS

## 2021-12-15 MED ORDER — EPHEDRINE 5 MG/ML INJ
INTRAVENOUS | Status: AC
  Filled 2021-12-15: qty 5

## 2021-12-15 MED ORDER — ACETAMINOPHEN 10 MG/ML IV SOLN: 1000.0000 mg | Freq: Once | INTRAVENOUS | Status: DC | PRN

## 2021-12-15 MED ORDER — ONDANSETRON HCL 4 MG/2ML IJ SOLN: 4.0000 mg | Freq: Once | INTRAMUSCULAR | Status: DC | PRN

## 2021-12-15 MED ORDER — LIDOCAINE HCL (CARDIAC) PF 100 MG/5ML IV SOSY
PREFILLED_SYRINGE | INTRAVENOUS | Status: DC | PRN
  Administered 2021-12-15: 80 mg via INTRAVENOUS

## 2021-12-15 MED ORDER — HYDROMORPHONE HCL 1 MG/ML IJ SOLN
1.0000 mg | Freq: Once | INTRAMUSCULAR | Status: AC
  Administered 2021-12-15: 1 mg via INTRAVENOUS
  Filled 2021-12-15: qty 1

## 2021-12-15 MED ORDER — STERILE WATER FOR IRRIGATION IR SOLN
Status: DC | PRN
  Administered 2021-12-15: 500 mL

## 2021-12-15 MED ORDER — PROPOFOL 10 MG/ML IV BOLUS
INTRAVENOUS | Status: DC | PRN
  Administered 2021-12-15: 150 mg via INTRAVENOUS
  Administered 2021-12-15: 50 mg via INTRAVENOUS

## 2021-12-15 MED ORDER — OXYCODONE-ACETAMINOPHEN 5-325 MG PO TABS
1.0000 | ORAL_TABLET | Freq: Once | ORAL | Status: AC
  Administered 2021-12-15: 1 via ORAL
  Filled 2021-12-15: qty 1

## 2021-12-15 MED ORDER — IOHEXOL 180 MG/ML  SOLN
INTRAMUSCULAR | Status: DC | PRN
  Administered 2021-12-15: 10 mL

## 2021-12-15 MED ORDER — PROPOFOL 10 MG/ML IV BOLUS
INTRAVENOUS | Status: AC
  Filled 2021-12-15: qty 20

## 2021-12-15 MED ORDER — LIDOCAINE HCL (PF) 2 % IJ SOLN
INTRAMUSCULAR | Status: AC
  Filled 2021-12-15: qty 5

## 2021-12-15 MED ORDER — FENTANYL CITRATE (PF) 100 MCG/2ML IJ SOLN
INTRAMUSCULAR | Status: AC
  Filled 2021-12-15: qty 2

## 2021-12-15 MED ORDER — DEXMEDETOMIDINE (PRECEDEX) IN NS 20 MCG/5ML (4 MCG/ML) IV SYRINGE
PREFILLED_SYRINGE | INTRAVENOUS | Status: DC | PRN
  Administered 2021-12-15 (×2): 4 ug via INTRAVENOUS

## 2021-12-15 MED ORDER — SODIUM CHLORIDE 0.9 % IR SOLN
Status: DC | PRN
  Administered 2021-12-15: 3000 mL

## 2021-12-15 MED ORDER — ONDANSETRON HCL 4 MG/2ML IJ SOLN
INTRAMUSCULAR | Status: DC | PRN
  Administered 2021-12-15: 4 mg via INTRAVENOUS

## 2021-12-15 MED ORDER — LACTATED RINGERS IV SOLN: INTRAVENOUS | Status: DC | PRN

## 2021-12-15 MED ORDER — NICOTINE 21 MG/24HR TD PT24
21.0000 mg | MEDICATED_PATCH | Freq: Every day | TRANSDERMAL | Status: DC
  Administered 2021-12-15 – 2021-12-18 (×4): 21 mg via TRANSDERMAL
  Filled 2021-12-15 (×4): qty 1

## 2021-12-15 MED ORDER — SENNOSIDES-DOCUSATE SODIUM 8.6-50 MG PO TABS: 1.0000 | ORAL_TABLET | Freq: Every evening | ORAL | Status: DC | PRN

## 2021-12-15 MED ORDER — HYDROMORPHONE HCL 1 MG/ML IJ SOLN
0.5000 mg | INTRAMUSCULAR | Status: DC | PRN
  Administered 2021-12-15 – 2021-12-16 (×3): 1 mg via INTRAVENOUS
  Filled 2021-12-15 (×3): qty 1

## 2021-12-15 MED ORDER — DEXAMETHASONE SODIUM PHOSPHATE 10 MG/ML IJ SOLN
INTRAMUSCULAR | Status: DC | PRN
  Administered 2021-12-15: 10 mg via INTRAVENOUS

## 2021-12-15 MED ORDER — LACTATED RINGERS IV SOLN: INTRAVENOUS | Status: DC

## 2021-12-15 MED ORDER — IPRATROPIUM-ALBUTEROL 0.5-2.5 (3) MG/3ML IN SOLN
3.0000 mL | RESPIRATORY_TRACT | Status: DC | PRN
  Administered 2021-12-15 – 2021-12-17 (×3): 3 mL via RESPIRATORY_TRACT
  Filled 2021-12-15 (×3): qty 3

## 2021-12-15 MED ORDER — ALBUTEROL SULFATE HFA 108 (90 BASE) MCG/ACT IN AERS
INHALATION_SPRAY | RESPIRATORY_TRACT | Status: DC | PRN
  Administered 2021-12-15 (×2): 4 via RESPIRATORY_TRACT

## 2021-12-15 MED ORDER — FENTANYL CITRATE (PF) 100 MCG/2ML IJ SOLN
INTRAMUSCULAR | Status: AC
  Administered 2021-12-15: 25 ug via INTRAVENOUS
  Filled 2021-12-15: qty 2

## 2021-12-15 MED ORDER — ROCURONIUM BROMIDE 10 MG/ML (PF) SYRINGE
PREFILLED_SYRINGE | INTRAVENOUS | Status: AC
  Filled 2021-12-15: qty 10

## 2021-12-15 MED ORDER — OXYCODONE HCL 5 MG PO TABS
5.0000 mg | ORAL_TABLET | ORAL | Status: DC | PRN
  Administered 2021-12-15 – 2021-12-17 (×4): 5 mg via ORAL
  Filled 2021-12-15 (×4): qty 1

## 2021-12-15 MED ORDER — OXYBUTYNIN CHLORIDE ER 10 MG PO TB24
10.0000 mg | ORAL_TABLET | Freq: Every day | ORAL | Status: DC
  Administered 2021-12-16 – 2021-12-18 (×3): 10 mg via ORAL
  Filled 2021-12-15 (×3): qty 1

## 2021-12-15 MED ORDER — MIDAZOLAM HCL 2 MG/2ML IJ SOLN
INTRAMUSCULAR | Status: DC | PRN
  Administered 2021-12-15: 2 mg via INTRAVENOUS

## 2021-12-15 MED ORDER — EPHEDRINE SULFATE (PRESSORS) 50 MG/ML IJ SOLN
INTRAMUSCULAR | Status: DC | PRN
  Administered 2021-12-15: 10 mg via INTRAVENOUS

## 2021-12-15 MED ORDER — PHENYLEPHRINE HCL (PRESSORS) 10 MG/ML IV SOLN
INTRAVENOUS | Status: DC | PRN
  Administered 2021-12-15 (×2): 160 ug via INTRAVENOUS
  Administered 2021-12-15: 80 ug via INTRAVENOUS

## 2021-12-15 MED ORDER — SODIUM CHLORIDE 0.9 % IV SOLN
2.0000 g | Freq: Once | INTRAVENOUS | Status: AC
  Administered 2021-12-15: 2 g via INTRAVENOUS
  Filled 2021-12-15: qty 20

## 2021-12-15 MED ORDER — SUCCINYLCHOLINE CHLORIDE 200 MG/10ML IV SOSY
PREFILLED_SYRINGE | INTRAVENOUS | Status: DC | PRN
  Administered 2021-12-15: 100 mg via INTRAVENOUS

## 2021-12-15 MED ORDER — MIDAZOLAM HCL 2 MG/2ML IJ SOLN
INTRAMUSCULAR | Status: AC
  Filled 2021-12-15: qty 2

## 2021-12-15 MED ORDER — ONDANSETRON HCL 4 MG/2ML IJ SOLN
INTRAMUSCULAR | Status: AC
  Filled 2021-12-15: qty 2

## 2021-12-15 MED ORDER — ONDANSETRON HCL 4 MG/2ML IJ SOLN
4.0000 mg | Freq: Once | INTRAMUSCULAR | Status: AC
  Administered 2021-12-15: 4 mg via INTRAVENOUS
  Filled 2021-12-15: qty 2

## 2021-12-15 MED ORDER — DEXAMETHASONE SODIUM PHOSPHATE 10 MG/ML IJ SOLN
INTRAMUSCULAR | Status: AC
  Filled 2021-12-15: qty 1

## 2021-12-15 MED ORDER — IPRATROPIUM-ALBUTEROL 0.5-2.5 (3) MG/3ML IN SOLN: 3.0000 mL | Freq: Once | RESPIRATORY_TRACT | Status: AC

## 2021-12-15 MED ORDER — OXYCODONE HCL 5 MG PO TABS: 5.0000 mg | ORAL_TABLET | Freq: Once | ORAL | Status: DC | PRN

## 2021-12-15 MED ORDER — SODIUM CHLORIDE 0.9 % IV BOLUS (SEPSIS)
1000.0000 mL | Freq: Once | INTRAVENOUS | Status: AC
  Administered 2021-12-15: 1000 mL via INTRAVENOUS

## 2021-12-15 MED ORDER — IPRATROPIUM-ALBUTEROL 0.5-2.5 (3) MG/3ML IN SOLN
RESPIRATORY_TRACT | Status: AC
  Administered 2021-12-15: 3 mL via RESPIRATORY_TRACT
  Filled 2021-12-15: qty 3

## 2021-12-15 MED ORDER — ACETAMINOPHEN 10 MG/ML IV SOLN
INTRAVENOUS | Status: DC | PRN
  Administered 2021-12-15: 1000 mg via INTRAVENOUS

## 2021-12-15 MED ORDER — SODIUM CHLORIDE 0.9 % IV SOLN
2.0000 g | INTRAVENOUS | Status: DC
  Administered 2021-12-16 – 2021-12-17 (×2): 2 g via INTRAVENOUS
  Filled 2021-12-15 (×2): qty 20

## 2021-12-15 MED ORDER — ACETAMINOPHEN 325 MG PO TABS
650.0000 mg | ORAL_TABLET | ORAL | Status: DC | PRN
Start: 2021-12-15 — End: 2021-12-18

## 2021-12-15 MED ORDER — SORBITOL 70 % SOLN: 30.0000 mL | Freq: Every day | Status: DC | PRN

## 2021-12-15 MED ORDER — ACETAMINOPHEN 10 MG/ML IV SOLN
INTRAVENOUS | Status: AC
Start: 2021-12-15 — End: ?
  Filled 2021-12-15: qty 100

## 2021-12-15 MED ORDER — SODIUM CHLORIDE 0.9 % IV SOLN: 1.0000 g | INTRAVENOUS | Status: DC

## 2021-12-15 MED ORDER — ROCURONIUM BROMIDE 100 MG/10ML IV SOLN: INTRAVENOUS | Status: DC | PRN

## 2021-12-15 SURGICAL SUPPLY — 19 items
BRUSH SCRUB EZ 1% IODOPHOR (MISCELLANEOUS) ×2 IMPLANT
CATH URETL OPEN 5X70 (CATHETERS) ×2 IMPLANT
GLOVE SURG UNDER POLY LF SZ7.5 (GLOVE) ×2 IMPLANT
GOWN STRL REUS W/ TWL LRG LVL3 (GOWN DISPOSABLE) ×1 IMPLANT
GOWN STRL REUS W/ TWL XL LVL3 (GOWN DISPOSABLE) ×1 IMPLANT
GOWN STRL REUS W/TWL LRG LVL3 (GOWN DISPOSABLE) ×2
GOWN STRL REUS W/TWL XL LVL3 (GOWN DISPOSABLE) ×2
GUIDEWIRE STR DUAL SENSOR (WIRE) ×2 IMPLANT
IV NS IRRIG 3000ML ARTHROMATIC (IV SOLUTION) ×2 IMPLANT
KIT TURNOVER CYSTO (KITS) ×2 IMPLANT
PACK CYSTO AR (MISCELLANEOUS) ×2 IMPLANT
SET CYSTO W/LG BORE CLAMP LF (SET/KITS/TRAYS/PACK) ×2 IMPLANT
STENT URET 6FRX24 CONTOUR (STENTS) ×1 IMPLANT
STENT URET 6FRX26 CONTOUR (STENTS) IMPLANT
SURGILUBE 2OZ TUBE FLIPTOP (MISCELLANEOUS) ×2 IMPLANT
SYR 10ML LL (SYRINGE) ×2 IMPLANT
TRAP FLUID SMOKE EVACUATOR (MISCELLANEOUS) ×2 IMPLANT
TRAY FOLEY SLVR 16FR LF STAT (SET/KITS/TRAYS/PACK) ×1 IMPLANT
WATER STERILE IRR 500ML POUR (IV SOLUTION) ×2 IMPLANT

## 2021-12-15 NOTE — ED Notes (Signed)
Paper consent filed with chart, pt changed into gown at this time, belongings placed in pt belonging bag.

## 2021-12-15 NOTE — Assessment & Plan Note (Signed)
Noted. At baseline. The patient has rhonchi throughout his lung fields. Will make albuterol available as needed.

## 2021-12-15 NOTE — Transfer of Care (Signed)
Immediate Anesthesia Transfer of Care Note  Patient: Willie White  Procedure(s) Performed: CYSTOSCOPY WITH RIGHT URETERAL STENT PLACEMENT (Right)  Patient Location: PACU  Anesthesia Type:General  Level of Consciousness: drowsy  Airway & Oxygen Therapy: Patient Spontanous Breathing and Patient connected to face mask oxygen  Post-op Assessment: Report given to RN and Post -op Vital signs reviewed and stable  Post vital signs: Reviewed and stable  Last Vitals:  Vitals Value Taken Time  BP 94/60 12/15/21 1141  Temp    Pulse 84 12/15/21 1145  Resp 26 12/15/21 1145  SpO2 98 % 12/15/21 1145  Vitals shown include unvalidated device data.  Last Pain:  Vitals:   12/15/21 0956  TempSrc: Temporal  PainSc: 9          Complications: No notable events documented.

## 2021-12-15 NOTE — Op Note (Signed)
Preoperative diagnosis:  Obstructing right UPJ calculus Solitary right kidney Leukocytosis Pyuria  Postoperative diagnosis:  Obstructing right UPJ calculus Solitary right kidney  Pyonephrosis  Procedure:  Cystoscopy Right ureteral stent placement (69F/24 cm)  Right retrograde pyelography with interpretation  Intraoperative fluoroscopy < 30 minutes  Surgeon: Nicki Reaper C. Tarren Velardi, M.D.  Anesthesia: General  Complications: None  Intraoperative findings:  Cystoscopy-urethra normal in caliber without stricture.  Prostate with mild lateral lobe enlargement, scattered calculi and moderate bladder neck elevation.  Bladder mucosa without erythema, solid or papillary lesions right UO orthotopic; no reflux noted.  Left UO not identified.  Moderate bladder trabeculation Right retrograde pyelogram-normal caliber ureter abruptly tapering to the region of the UPJ.  No contrast seen proximal to the stone. 20 cc purulent urine aspirated from the right renal pelvis  EBL: Minimal  Specimens: Urine right renal pelvis for culture and Gram stain  Indication: Willie White is a 61 y.o. male with a solitary right kidney admitted with right flank pain secondary to a 9 mm UPJ calculus.  UA showed >50 WBC/RBC.  Leukocytosis at 16.9 K, elevated lactate 3.2.  After reviewing the management options for treatment, he elected to proceed with the above surgical procedure(s). We have discussed the potential benefits and risks of the procedure, side effects of the proposed treatment, the likelihood of the patient achieving the goals of the procedure, and any potential problems that might occur during the procedure or recuperation. Informed consent has been obtained.  Description of procedure:  The patient was taken to the operating room and general anesthesia was induced.  The patient was placed in the dorsal lithotomy position, prepped and draped in the usual sterile fashion, and preoperative antibiotics were  administered. A preoperative time-out was performed.   A 21 French cystoscope was lubricated, passed per urethra and advanced proximally under direct vision with findings as described above.  Attention then directed to the right ureteral orifice and a 57F ureteral catheter was used to intubate the ureteral orifice.  Omnipaque contrast was injected through the ureteral catheter and a retrograde pyelogram was performed with findings as dictated above.  A 0.038 Sensor wire was then placed through the ureteral catheter and was able to be negotiated passed the calculus into the renal pelvis.  Once the guidewire was placed in the renal pelvis there was brisk efflux of purulent urine noted from the UO.  The ureteral catheter was then advanced over the guidewire to the renal pelvis.  The guidewire was removed and 20 cc of purulent urine was aspirated and sent for culture and Gram stain.  The guidewire was replaced and the ureteral catheter was removed.  A 69F/24 cm Contour ureteral stent was then placed over the guidewire and advanced with the pusher.  The guidewire was removed and a good curl was seen in the renal pelvis under fluoroscopy and in the bladder under direct vision  All instruments were removed.  A 16 French Foley catheter was placed to achieve maximal urinary drainage.  The balloon was inflated with 10 cc of sterile water.  The catheter was placed to gravity drainage.  The patient appeared to tolerate the procedure well and without complications.  After anesthetic reversal he was transported to the PACU in stable condition.   Recommendation: If clinically improving remove Foley catheter after 48 hours He was having voiding difficulty on admission and will obtain bladder scan for PVR after Foley catheter removed Will need definitive stone treatment ureteroscopic stone removal 2-3 weeks  John Giovanni, MD

## 2021-12-15 NOTE — Anesthesia Procedure Notes (Signed)
Procedure Name: Intubation Date/Time: 12/15/2021 10:57 AM  Performed by: Jerrye Noble, CRNAPre-anesthesia Checklist: Patient identified, Emergency Drugs available, Suction available and Patient being monitored Patient Re-evaluated:Patient Re-evaluated prior to induction Oxygen Delivery Method: Circle system utilized Preoxygenation: Pre-oxygenation with 100% oxygen Induction Type: IV induction Ventilation: Oral airway inserted - appropriate to patient size and Two handed mask ventilation required Laryngoscope Size: McGraph and 3 Grade View: Grade I Tube type: Oral Tube size: 7.0 mm Number of attempts: 1 Airway Equipment and Method: Stylet, Oral airway and Video-laryngoscopy Placement Confirmation: ETT inserted through vocal cords under direct vision, positive ETCO2 and breath sounds checked- equal and bilateral Secured at: 22 cm Tube secured with: Tape Dental Injury: Teeth and Oropharynx as per pre-operative assessment

## 2021-12-15 NOTE — H&P (Signed)
History and Physical    Patient: Willie White DOB: 28-Jul-1960 DOA: 12/15/2021 DOS: the patient was seen and examined on 12/15/2021 PCP: Pcp, No  Patient coming from: Home  Chief Complaint:  Chief Complaint  Patient presents with   Abdominal Pain   HPI: Willie White is a 61 y.o. male with medical history significant for  hypertension, COPD not on home oxygen, solitary right kidney who presented to [facility] ED on 12/15/2021 with complaints of severe suprapubic abdominal pain worse on the right. He also states that he has been unable to urinate. He has had nausea and vomiting, but no fever or chills.   On CT the patient was found to have a 9 mm obstructing renal calculus at eht right UPH with numerous 1-2 mm nonobstructing right renal calculi. There was also perinephric inflammatory fat stranding. MRI is recommended to rule out presence of an underlying neoplastic process. The left kidney is absent. There are bilateral low-attenuation adrenal masses consistent with adrenal adenomas. There are large bilateral hydroceles. There is also ASCVD.  Urology was consulted and the patient underwent cystoscopy with right ureteral stent this morning. Review of Systems: As mentioned in the history of present illness. All other systems reviewed and are negative. Past Medical History:  Diagnosis Date   COPD (chronic obstructive pulmonary disease) ([place])    Hypertension    Past Surgical History:  Procedure Laterality Date   HAND SURGERY Left    INNER EAR SURGERY Left    Social History:  reports that he has been smoking cigarettes. He has been smoking an average of 1 pack per day. He has never used smokeless tobacco. He reports current alcohol use. He reports that he does not use drugs.  Allergies  Allergen Reactions   Codeine Other (See Comments)    Tremors    History reviewed. No pertinent family history.  Prior to Admission medications   Medication Sig Start Date End Date Taking?  Authorizing Provider  albuterol (VENTOLIN HFA) 108 (90 Base) MCG/ACT inhaler Inhale 2 puffs into the lungs every 6 (six) hours as needed for wheezing or shortness of breath. Patient not taking: Reported on 12/15/2021 09/02/19   Johnn Hai, PA-C  albuterol (VENTOLIN HFA) 108 (90 Base) MCG/ACT inhaler Inhale 2 puffs into the lungs every 6 (six) hours as needed for wheezing or shortness of breath. Patient not taking: Reported on 12/15/2021 09/30/20   Vladimir Crofts, MD  clindamycin (CLEOCIN) 150 MG capsule 2 tabs tid x 7 days Patient not taking: Reported on 12/15/2021 09/02/19   Johnn Hai, PA-C  HYDROcodone-acetaminophen (NORCO/VICODIN) 5-325 MG tablet Take 1 tablet by mouth every 6 (six) hours as needed for moderate pain. Patient not taking: Reported on 12/15/2021 09/02/19   Johnn Hai, PA-C    Physical Exam: Vitals:   12/15/21 1200 12/15/21 1300 12/15/21 1333 12/15/21 1515  BP: 100/69 107/76 112/75 112/75  Pulse: 78 80 81 81  Resp: 20 (!) '26 18 18  '$ Temp: 99 F (37.2 C) 98.2 F (36.8 C) 98.7 F (37.1 C) 97.8 F (36.6 C)  TempSrc:   Oral Oral  SpO2: 98% 96% 96% 94%  Weight:      Height:       Exam:  Constitutional:  The patient is seen following his procedure. He is awake, alert, and oriented x 3. No acute distress. Respiratory:  No increased work of breathing. No wheezes, rales, or rhonchi No tactile fremitus Cardiovascular:  Regular rate and rhythm No murmurs,  ectopy, or gallups. No lateral PMI. No thrills. Abdomen:  Abdomen is soft, non-tender, non-distended No hernias, masses, or organomegaly Normoactive bowel sounds.  Musculoskeletal:  No cyanosis, clubbing, or edema Skin:  No rashes, lesions, ulcers palpation of skin: no induration or nodules Neurologic:  CN 2-12 intact Sensation all 4 extremities intact Psychiatric:  Mental status Mood, affect appropriate Orientation to person, place, time  judgment and insight appear intact    Assessment and  Plan: Assessment and Plan: * Acute unilateral obstructive uropathy S/P cystoscopy and right ureteral stent placement. I appreciate urology's assistance.  Hypertension The patient is normotensive without antihypertensive therapy. It does not appear that he is on anything at home.   COPD (chronic obstructive pulmonary disease) ([place]) Noted. At baseline. The patient has rhonchi throughout his lung fields. Will make albuterol available as needed.  Pyelonephritis The patient is receiving rocephin 2 gm IV daily. Urine cultures and blood cultures are pending.     Advance Care Planning:   Code Status: Full Code   Consults: Urology  Family Communication: None available  Severity of Illness: The appropriate patient status for this patient is INPATIENT. Inpatient status is judged to be reasonable and necessary in order to provide the required intensity of service to ensure the patient's safety. The patient's presenting symptoms, physical exam findings, and initial radiographic and laboratory data in the context of their chronic comorbidities is felt to place them at high risk for further clinical deterioration. Furthermore, it is not anticipated that the patient will be medically stable for discharge from the hospital within 2 midnights of admission.   * I certify that at the point of admission it is my clinical judgment that the patient will require inpatient hospital care spanning beyond 2 midnights from the point of admission due to high intensity of service, high risk for further deterioration and high frequency of surveillance required.*  Author: Karie Kirks, DO 12/15/2021 6:33 PM  For on call review [email or web address].

## 2021-12-15 NOTE — Plan of Care (Signed)
  Problem: Nutrition: Goal: Adequate nutrition will be maintained Outcome: Progressing   Problem: Pain Managment: Goal: General experience of comfort will improve Outcome: Progressing   Problem: Safety: Goal: Ability to remain free from injury will improve Outcome: Progressing   

## 2021-12-15 NOTE — ED Triage Notes (Signed)
Pt presents via POV with complaints of R side pain that started about 4 hours ago with associated nausea. Of note, the pt only has one kidney on the right side. Denies CP or SOB.

## 2021-12-15 NOTE — ED Notes (Addendum)
Admitting MD made aware of LA via secure chat as well as OR RN

## 2021-12-15 NOTE — Assessment & Plan Note (Addendum)
S/P cystoscopy and right ureteral stent placement. I appreciate urology's assistance. The patient is to follow up with urology as outpatient in 2-3 weeks as directed.

## 2021-12-15 NOTE — ED Notes (Signed)
Unable to visualize bladder during bladder scan. MD Ward made aware.

## 2021-12-15 NOTE — Progress Notes (Signed)
Patient awake /alert x4. Tolerating po liquids without event. IVF's NSS plain.  Temp upon arrival to pacu 100.9  upon transfer to room 98.2 Patient states he smokes a pack of cigarettes a day, baseline wheezes, he also states he sleeps in a recliner. Uses inhaler thru out the day.  Verbalizes understanding need for admission, pain management as needed. Only c/o is that he is hungry.

## 2021-12-15 NOTE — Anesthesia Preprocedure Evaluation (Addendum)
Anesthesia Evaluation  Patient identified by MRN, date of birth, ID band Patient awake    Reviewed: Allergy & Precautions, NPO status , Patient's Chart, lab work & pertinent test results  History of Anesthesia Complications Negative for: history of anesthetic complications  Airway Mallampati: IV   Neck ROM: Full    Dental  (+) Poor Dentition   Pulmonary COPD, Current Smoker (1 ppd) and Patient abstained from smoking.,    Pulmonary exam normal breath sounds clear to auscultation       Cardiovascular hypertension, Normal cardiovascular exam Rhythm:Regular Rate:Normal     Neuro/Psych negative neurological ROS     GI/Hepatic negative GI ROS,   Endo/Other  negative endocrine ROS  Renal/GU Renal disease (solitary kidney)     Musculoskeletal   Abdominal   Peds  Hematology negative hematology ROS (+)   Anesthesia Other Findings   Reproductive/Obstetrics                            Anesthesia Physical Anesthesia Plan  ASA: 3 and emergent  Anesthesia Plan: General   Post-op Pain Management:    Induction: Intravenous and Rapid sequence  PONV Risk Score and Plan: 1 and Ondansetron, Dexamethasone and Treatment may vary due to age or medical condition  Airway Management Planned: Oral ETT  Additional Equipment:   Intra-op Plan:   Post-operative Plan: Extubation in OR  Informed Consent: I have reviewed the patients History and Physical, chart, labs and discussed the procedure including the risks, benefits and alternatives for the proposed anesthesia with the patient or authorized representative who has indicated his/her understanding and acceptance.     Dental advisory given  Plan Discussed with: CRNA  Anesthesia Plan Comments: (Patient consented for risks of anesthesia including but not limited to:  - adverse reactions to medications - damage to eyes, teeth, lips or other oral  mucosa - nerve damage due to positioning  - sore throat or hoarseness - damage to heart, brain, nerves, lungs, other parts of body or loss of life  Informed patient about role of CRNA in peri- and intra-operative care.  Patient voiced understanding.)        Anesthesia Quick Evaluation

## 2021-12-15 NOTE — Assessment & Plan Note (Addendum)
The patient is receiving rocephin 2 gm IV daily. 1/2 blood cultures has grown out E.coli. Urine culture has grown out E. Coli. E. Coli is pan-sensitive. Will repeat blood cultures tomorrow.The patient has been converted to cipro for discharge to home.

## 2021-12-15 NOTE — ED Notes (Signed)
Report to [place] from [facility]

## 2021-12-15 NOTE — Anesthesia Postprocedure Evaluation (Signed)
Anesthesia Post Note  Patient: Llewellyn S Martine  Procedure(s) Performed: CYSTOSCOPY WITH RIGHT URETERAL STENT PLACEMENT (Right)  Patient location during evaluation: PACU Anesthesia Type: General Level of consciousness: awake and alert, oriented and patient cooperative Pain management: pain level controlled Vital Signs Assessment: post-procedure vital signs reviewed and stable Respiratory status: spontaneous breathing, nonlabored ventilation and respiratory function stable Cardiovascular status: blood pressure returned to baseline and stable Postop Assessment: adequate PO intake Anesthetic complications: no   No notable events documented.   Last Vitals:  Vitals:   12/15/21 1141 12/15/21 1200  BP: 94/60 100/69  Pulse: 84 78  Resp: (!) 28 20  Temp: (!) 38.3 C 37.2 C  SpO2: 98% 98%    Last Pain:  Vitals:   12/15/21 1200  TempSrc:   PainSc: 0-No pain                 Darrin Nipper

## 2021-12-15 NOTE — Assessment & Plan Note (Signed)
The patient is normotensive without antihypertensive therapy. It does not appear that he is on anything at home.

## 2021-12-15 NOTE — ED Provider Notes (Signed)
[facility] Provider Note    Event Date/Time   First MD Initiated Contact with Patient 12/15/21 [phone]     (approximate)   History   Abdominal Pain   HPI  Willie White is a 61 y.o. male with history of hypertension, COPD not on home oxygen, solitary right kidney who presents to the emergency department with complaints of suprapubic abdominal pain worse on the right side, difficulty urinating that started several hours prior to arrival.  Has had nausea and vomiting.  No fevers or chills.  No previous history of similar symptoms.  Last had anything to eat around 5 to 6 PM last night.   History provided by patient.    Past Medical History:  Diagnosis Date   Hypertension     Past Surgical History:  Procedure Laterality Date   HAND SURGERY Left    INNER EAR SURGERY Left     MEDICATIONS:  Prior to Admission medications   Medication Sig Start Date End Date Taking? Authorizing Provider  albuterol (VENTOLIN HFA) 108 (90 Base) MCG/ACT inhaler Inhale 2 puffs into the lungs every 6 (six) hours as needed for wheezing or shortness of breath. 09/02/19   Johnn Hai, PA-C  albuterol (VENTOLIN HFA) 108 (90 Base) MCG/ACT inhaler Inhale 2 puffs into the lungs every 6 (six) hours as needed for wheezing or shortness of breath. 09/30/20   Vladimir Crofts, MD  clindamycin (CLEOCIN) 150 MG capsule 2 tabs tid x 7 days 09/02/19   Johnn Hai, PA-C  HYDROcodone-acetaminophen (NORCO/VICODIN) 5-325 MG tablet Take 1 tablet by mouth every 6 (six) hours as needed for moderate pain. 09/02/19   Johnn Hai, PA-C    Physical Exam   Triage Vital Signs: ED Triage Vitals  Enc Vitals Group     BP 12/15/21 0203 (!) 161/96     Pulse Rate 12/15/21 0203 72     Resp 12/15/21 0203 20     Temp 12/15/21 0203 97.7 F (36.5 C)     Temp Source 12/15/21 0203 Oral     SpO2 12/15/21 0203 96 %     Weight 12/15/21 0203 196 lb (88.9 kg)     Height 12/15/21 0203 '5\' 8"'$  (1.727 m)      Head Circumference --      Peak Flow --      Pain Score 12/15/21 0206 10     Pain Loc --      Pain Edu? --      Excl. in [place]? --     Most recent vital signs: Vitals:   12/15/21 0203 12/15/21 0612  BP: (!) 161/96 (!) 140/64  Pulse: 72 79  Resp: 20 (!) 22  Temp: 97.7 F (36.5 C) 98.7 F (37.1 C)  SpO2: 96% 96%    CONSTITUTIONAL: Alert and oriented and responds appropriately to questions.  Medically ill-appearing, appears uncomfortable HEAD: Normocephalic, atraumatic EYES: Conjunctivae clear, pupils appear equal, sclera nonicteric ENT: normal nose; moist mucous membranes NECK: Supple, normal ROM CARD: RRR; S1 and S2 appreciated; no murmurs, no clicks, no rubs, no gallops RESP: Normal chest excursion without splinting or tachypnea; breath sounds clear and equal bilaterally; no wheezes, no rhonchi, no rales, no hypoxia or respiratory distress, speaking full sentences ABD/GI: Normal bowel sounds; non-distended; soft, tender throughout the lower abdomen without guarding or rebound BACK: The back appears normal EXT: Normal ROM in all joints; no deformity noted, no edema; no cyanosis SKIN: Normal color for age and race; warm; no  rash on exposed skin NEURO: Moves all extremities equally, normal speech PSYCH: The patient's mood and manner are appropriate.   ED Results / Procedures / Treatments   LABS: (all labs ordered are listed, but only abnormal results are displayed) Labs Reviewed  COMPREHENSIVE METABOLIC PANEL - Abnormal; Notable for the following components:      Result Value   Glucose, Bld 156 (*)    Creatinine, Ser 1.39 (*)    Total Protein 8.3 (*)    GFR, Estimated 57 (*)    All other components within normal limits  CBC - Abnormal; Notable for the following components:   WBC 16.9 (*)    Platelets 494 (*)    All other components within normal limits  URINALYSIS, ROUTINE W REFLEX MICROSCOPIC - Abnormal; Notable for the following components:   Color, Urine AMBER (*)     APPearance TURBID (*)    Hgb urine dipstick LARGE (*)    Protein, ur 100 (*)    Leukocytes,Ua LARGE (*)    RBC / HPF >50 (*)    WBC, UA >50 (*)    Bacteria, UA MANY (*)    All other components within normal limits  URINE CULTURE  CULTURE, BLOOD (ROUTINE X 2)  CULTURE, BLOOD (ROUTINE X 2)  LIPASE, BLOOD  PROCALCITONIN  LACTIC ACID, PLASMA  LACTIC ACID, PLASMA     EKG:   RADIOLOGY: My personal review and interpretation of imaging: CT scan shows proximal right ureteral stone.  I have personally reviewed all radiology reports.   CT Renal Stone Study  Result Date: 12/15/2021 CLINICAL DATA:  Right-sided abdominal pain and nausea. EXAM: CT ABDOMEN AND PELVIS WITHOUT CONTRAST TECHNIQUE: Multidetector CT imaging of the abdomen and pelvis was performed following the standard protocol without IV contrast. RADIATION DOSE REDUCTION: This exam was performed according to the departmental dose-optimization program which includes automated exposure control, adjustment of the mA and/or kV according to patient size and/or use of iterative reconstruction technique. COMPARISON:  None Available. FINDINGS: Lower chest: No acute abnormality. Hepatobiliary: No focal liver abnormality is seen. No gallstones, gallbladder wall thickening, or biliary dilatation. Pancreas: Unremarkable. No pancreatic ductal dilatation or surrounding inflammatory changes. Spleen: Normal in size without focal abnormality. Adrenals/Urinary Tract: A 2.7 cm x 2.4 cm low-attenuation (approximately -12.08 Hounsfield units) right adrenal mass is seen. A similar appearing 1.2 cm x 1.3 cm low-attenuation (approximately 5.93 Hounsfield units) left adrenal mass is also noted. The left kidney is not visualized. The right kidney is enlarged. A 4.2 cm x 3.2 cm x 5.2 cm complex cystic appearing area is seen within the mid right kidney. Numerous 1 mm and 2 mm nonobstructing renal calculi are seen throughout the right kidney. A 9 mm obstructing renal  calculus is seen at the right UPJ, with moderate severity right-sided hydronephrosis and moderate to marked severity right-sided perinephric inflammatory fat stranding. The urinary bladder is partially contracted and subsequently limited in evaluation. Mild diffuse urinary bladder wall thickening is seen. Stomach/Bowel: Stomach is within normal limits. Appendix appears normal. No evidence of bowel wall thickening, distention, or inflammatory changes. Vascular/Lymphatic: Aortic atherosclerosis. No enlarged abdominal or pelvic lymph nodes. Reproductive: A mild-to-moderate amount of parenchymal calcification is seen within a mildly enlarged prostate gland. Large bilateral hydroceles are seen. Other: No abdominal wall hernia or abnormality. No abdominopelvic ascites. Musculoskeletal: Multilevel degenerative changes seen throughout the lumbar spine. This is most prominent at the level of L5-S1. IMPRESSION: 1. 9 mm obstructing renal calculus at the right UPJ. 2.  Numerous 1 mm and 2 mm nonobstructing right renal calculi. 3. Perinephric inflammatory fat stranding which may be, in part, secondary to partial obstruction of the right kidney. Sequelae associated with superimposed acute pyelonephritis cannot be excluded. 4. Large complex cystic lesion within the mid right kidney. Further evaluation with MRI (without and with IV contrast) is recommended to exclude the presence of an underlying neoplastic process. 5. Absent left kidney. 6. 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 7. Large bilateral hydroceles. 8. Aortic atherosclerosis. Aortic Atherosclerosis (ICD10-I70.0). Electronically Signed   By: Virgina Norfolk M.D.   On: 12/15/2021 02:58     PROCEDURES:  Critical Care performed: Yes, see critical care procedure note(s)   CRITICAL CARE Performed by: Cyril Mourning Haani Bakula   Total critical care time: 45 minutes  Critical care time was exclusive of separately billable procedures  and treating other patients.  Critical care was necessary to treat or prevent imminent or life-threatening deterioration.  Critical care was time spent personally by me on the following activities: development of treatment plan with patient and/or surrogate as well as nursing, discussions with consultants, evaluation of patient's response to treatment, examination of patient, obtaining history from patient or surrogate, ordering and performing treatments and interventions, ordering and review of laboratory studies, ordering and review of radiographic studies, pulse oximetry and re-evaluation of patient's condition.   Marland Kitchen1-3 Lead EKG Interpretation  Performed by: Joncarlo Friberg, Delice Bison, DO Authorized by: Makynlie Rossini, Delice Bison, DO     Interpretation: normal     ECG rate:  72   ECG rate assessment: normal     Rhythm: sinus rhythm     Ectopy: none     Conduction: normal       IMPRESSION / MDM / ASSESSMENT AND PLAN / ED COURSE  I reviewed the triage vital signs and the nursing notes.    Patient here with suprapubic abdominal pain, nausea and vomiting.  The patient is on the cardiac monitor to evaluate for evidence of arrhythmia and/or significant heart rate changes.   DIFFERENTIAL DIAGNOSIS (includes but not limited to):   UTI, pyelonephritis, kidney stone, urinary retention   Patient's presentation is most consistent with acute presentation with potential threat to life or bodily function.   PLAN: CBC, CMP, lipase, urinalysis, CT renal study ordered from triage.  CBC shows leukocytosis of 17,000.  Creatinine is elevated at 1.39.  Was 1.26 and June 2022 and 0.91 and May 2021.  Normal LFTs and lipase.  Urine shows large leukocyte esterase, greater than 50,000 red blood cells and white blood cells and many bacteria.  Nitrite negative.  Culture is pending.  CT reviewed and interpreted by myself and the radiologist and shows a 9 mm obstructing stone at the right UPJ.  He also has perinephric stranding  which may be due to obstruction of the right kidney but also could be from acute pyelonephritis.  There is also a large complex cystic lesion within the mid right kidney that could be a neoplasm.  He does not have a left kidney.  We will give IV fluids, Rocephin, keep him n.p.o.  We will give pain and nausea medicine.  He has not on any blood thinners.  Will discuss with urology as I feel he will likely need admission and intervention today for this large obstructing infected ureteral stone in his solitary kidney.   MEDICATIONS GIVEN IN ED: Medications  cefTRIAXone (ROCEPHIN) 2 g in sodium chloride 0.9 % 100 mL IVPB (2 g Intravenous New Bag/Given  12/15/21 0646)  0.9 %  sodium chloride infusion ( Intravenous New Bag/Given 12/15/21 0646)  oxyCODONE-acetaminophen (PERCOCET/ROXICET) 5-325 MG per tablet 1 tablet (1 tablet Oral Given 12/15/21 0214)  sodium chloride 0.9 % bolus 1,000 mL (0 mLs Intravenous Stopped 12/15/21 0646)  HYDROmorphone (DILAUDID) injection 1 mg (1 mg Intravenous Given 12/15/21 0611)  ondansetron (ZOFRAN) injection 4 mg (4 mg Intravenous Given 12/15/21 0612)     ED COURSE: Patient's procalcitonin is negative however with his leukocytosis and infected appearing urine, I will continue with antibiotics until urine culture results.   CONSULTS:  6:33 AM  Discussed with Dr. Bernardo Heater on-call for urology.  Appreciate his help.  He agrees that patient likely needs a stent and at least 24 hours of IV antibiotics.  Recommends medicine admission.  Will discuss with the hospitalist.   Consulted and discussed patient's case with hospitalist, Dr. Sidney Ace.  I have recommended admission and consulting physician agrees and will place admission orders.  Patient (and family if present) agree with this plan.   I reviewed all nursing notes, vitals, pertinent previous records.  All labs, EKGs, imaging ordered have been independently reviewed and interpreted by myself.    OUTSIDE RECORDS REVIEWED: Reviewed  patient's previous admission in 2013 for chest pain.       FINAL CLINICAL IMPRESSION(S) / ED DIAGNOSES   Final diagnoses:  Pyelonephritis  Kidney stone     Rx / DC Orders   ED Discharge Orders     None        Note:  This document was prepared using Dragon voice recognition software and may include unintentional dictation errors.   Emylie Amster, Delice Bison, DO 12/15/21 [phone]

## 2021-12-15 NOTE — Progress Notes (Signed)
PHARMACY - PHYSICIAN COMMUNICATION CRITICAL VALUE ALERT - BLOOD CULTURE IDENTIFICATION (BCID)  Willie White is an 61 y.o. male who presented to [facility] on 12/15/2021 with a chief complaint of Obstructing stone and solitary kidney  Assessment:  BCID with 4 of 4 GNRs identified as Ecoli (no resistances detected) With suspected urinary source.  Imaging  - CT stone protocol study remarkable for a 9 mm right UPJ calculus with mild hydronephrosis.  Moderate-severe perinephric stranding noted.  Multiple 1-2 nonobstructing renal calculi present.   Name of physician (or Provider) Contacted: Dr. Benny Lennert  Current antibiotics: Rocephin 1g IV q24h.   Changes to prescribed antibiotics recommended:  Recommend increase Rocephin to 2g IV dose q24h. Rec accepted, orders modified.  Results for orders placed or performed during the hospital encounter of 12/15/21  Blood Culture ID Panel (Reflexed) (Collected: 12/15/2021  5:57 AM)  Result Value Ref Range   Enterococcus faecalis NOT DETECTED NOT DETECTED   Enterococcus Faecium NOT DETECTED NOT DETECTED   Listeria monocytogenes NOT DETECTED NOT DETECTED   Staphylococcus species NOT DETECTED NOT DETECTED   Staphylococcus aureus (BCID) NOT DETECTED NOT DETECTED   Staphylococcus epidermidis NOT DETECTED NOT DETECTED   Staphylococcus lugdunensis NOT DETECTED NOT DETECTED   Streptococcus species NOT DETECTED NOT DETECTED   Streptococcus agalactiae NOT DETECTED NOT DETECTED   Streptococcus pneumoniae NOT DETECTED NOT DETECTED   Streptococcus pyogenes NOT DETECTED NOT DETECTED   A.calcoaceticus-baumannii NOT DETECTED NOT DETECTED   Bacteroides fragilis NOT DETECTED NOT DETECTED   Enterobacterales DETECTED (A) NOT DETECTED   Enterobacter cloacae complex NOT DETECTED NOT DETECTED   Escherichia coli DETECTED (A) NOT DETECTED   Klebsiella aerogenes NOT DETECTED NOT DETECTED   Klebsiella oxytoca NOT DETECTED NOT DETECTED   Klebsiella pneumoniae NOT DETECTED NOT  DETECTED   Proteus species NOT DETECTED NOT DETECTED   Salmonella species NOT DETECTED NOT DETECTED   Serratia marcescens NOT DETECTED NOT DETECTED   Haemophilus influenzae NOT DETECTED NOT DETECTED   Neisseria meningitidis NOT DETECTED NOT DETECTED   Pseudomonas aeruginosa NOT DETECTED NOT DETECTED   Stenotrophomonas maltophilia NOT DETECTED NOT DETECTED   Candida albicans NOT DETECTED NOT DETECTED   Candida auris NOT DETECTED NOT DETECTED   Candida glabrata NOT DETECTED NOT DETECTED   Candida krusei NOT DETECTED NOT DETECTED   Candida parapsilosis NOT DETECTED NOT DETECTED   Candida tropicalis NOT DETECTED NOT DETECTED   Cryptococcus neoformans/gattii NOT DETECTED NOT DETECTED   CTX-M ESBL NOT DETECTED NOT DETECTED   Carbapenem resistance IMP NOT DETECTED NOT DETECTED   Carbapenem resistance KPC NOT DETECTED NOT DETECTED   Carbapenem resistance NDM NOT DETECTED NOT DETECTED   Carbapenem resist OXA 48 LIKE NOT DETECTED NOT DETECTED   Carbapenem resistance VIM NOT DETECTED NOT DETECTED    Lorna Dibble 12/15/2021  3:38 PM

## 2021-12-15 NOTE — Consult Note (Signed)
Urology Consult  Requesting physician: Pryor Curia, DO  Reason for consultation: Obstructing stone and solitary kidney  Chief Complaint: Kidney stone  History of Present Illness: Willie White is a 61 y.o. year who presented to the ED 0200 this a.m. with a 9-hour history of right lower quadrant abdominal pain radiating to the suprapubic area.  Pain rated severe without identifiable precipitating, aggravating or alleviating factors.  He has had nausea and vomiting.  No fever or chills.  History of congenital absence of the left kidney.  UA turbid with >50 RBC/>50 WBC and many bacteria; creatinine 1.39 and WBC 16.9 K  CT stone protocol study remarkable for a 9 mm right UPJ calculus with mild hydronephrosis.  Moderate-severe perinephric stranding noted.  Multiple 1-2 nonobstructing renal calculi present.  5 cm complex cystic lesion in the midportion of the right kidney.  Left kidney was nonvisualized which was visualized on a renal ultrasound in June 2022.  Past Medical History:  Diagnosis Date   Hypertension     Past Surgical History:  Procedure Laterality Date   HAND SURGERY Left    INNER EAR SURGERY Left     Home Medications:  No outpatient medications have been marked as taking for the 12/15/21 encounter [facility] Encounter).    Allergies:  Allergies  Allergen Reactions   Codeine Other (See Comments)    Tremors    History reviewed. No pertinent family history.  Social History:  reports that he has been smoking cigarettes. He has been smoking an average of 1 pack per day. He has never used smokeless tobacco. He reports current alcohol use. He reports that he does not use drugs.  ROS: A complete review of systems was performed.  All systems are negative except for pertinent findings as noted.  Physical Exam:  Vital signs in last 24 hours: Temp:  [97.7 F (36.5 C)-98.7 F (37.1 C)] 98.7 F (37.1 C) (08/16 0612) Pulse Rate:  [72-86] 86 (08/16 0701) Resp:  [20-22]  22 (08/16 0612) BP: (138-161)/(64-96) 138/64 (08/16 0701) SpO2:  [95 %-96 %] 95 % (08/16 0701) Weight:  [88.9 kg] 88.9 kg (08/16 0203) Constitutional:  Alert and oriented, No acute distress HEENT: [place] AT, moist mucus membranes.  Trachea midline, no masses Cardiovascular: Regular rate and rhythm, no clubbing, cyanosis, or edema. Respiratory: Normal respiratory effort, lungs clear bilaterally GI: Abdomen is soft, nontender, nondistended, no abdominal masses GU: No CVA tenderness Skin: No rashes, bruises or suspicious lesions Lymph: No cervical or inguinal adenopathy Neurologic: Grossly intact, no focal deficits, moving all 4 extremities Psychiatric: Normal mood and affect   Laboratory Data:  Recent Labs    12/15/21 0204  WBC 16.9*  HGB 13.9  HCT 43.0   Recent Labs    12/15/21 0204  NA 139  K 3.6  CL 104  CO2 27  GLUCOSE 156*  BUN 21  CREATININE 1.39*  CALCIUM 9.0    Radiologic Imaging: See images were personally reviewed and interpreted  CT Renal Stone Study  Result Date: 12/15/2021 CLINICAL DATA:  Right-sided abdominal pain and nausea. EXAM: CT ABDOMEN AND PELVIS WITHOUT CONTRAST TECHNIQUE: Multidetector CT imaging of the abdomen and pelvis was performed following the standard protocol without IV contrast. RADIATION DOSE REDUCTION: This exam was performed according to the departmental dose-optimization program which includes automated exposure control, adjustment of the mA and/or kV according to patient size and/or use of iterative reconstruction technique. COMPARISON:  None Available. FINDINGS: Lower chest: No acute abnormality. Hepatobiliary: No focal liver  abnormality is seen. No gallstones, gallbladder wall thickening, or biliary dilatation. Pancreas: Unremarkable. No pancreatic ductal dilatation or surrounding inflammatory changes. Spleen: Normal in size without focal abnormality. Adrenals/Urinary Tract: A 2.7 cm x 2.4 cm low-attenuation (approximately -12.08 Hounsfield  units) right adrenal mass is seen. A similar appearing 1.2 cm x 1.3 cm low-attenuation (approximately 5.93 Hounsfield units) left adrenal mass is also noted. The left kidney is not visualized. The right kidney is enlarged. A 4.2 cm x 3.2 cm x 5.2 cm complex cystic appearing area is seen within the mid right kidney. Numerous 1 mm and 2 mm nonobstructing renal calculi are seen throughout the right kidney. A 9 mm obstructing renal calculus is seen at the right UPJ, with moderate severity right-sided hydronephrosis and moderate to marked severity right-sided perinephric inflammatory fat stranding. The urinary bladder is partially contracted and subsequently limited in evaluation. Mild diffuse urinary bladder wall thickening is seen. Stomach/Bowel: Stomach is within normal limits. Appendix appears normal. No evidence of bowel wall thickening, distention, or inflammatory changes. Vascular/Lymphatic: Aortic atherosclerosis. No enlarged abdominal or pelvic lymph nodes. Reproductive: A mild-to-moderate amount of parenchymal calcification is seen within a mildly enlarged prostate gland. Large bilateral hydroceles are seen. Other: No abdominal wall hernia or abnormality. No abdominopelvic ascites. Musculoskeletal: Multilevel degenerative changes seen throughout the lumbar spine. This is most prominent at the level of L5-S1. IMPRESSION: 1. 9 mm obstructing renal calculus at the right UPJ. 2. Numerous 1 mm and 2 mm nonobstructing right renal calculi. 3. Perinephric inflammatory fat stranding which may be, in part, secondary to partial obstruction of the right kidney. Sequelae associated with superimposed acute pyelonephritis cannot be excluded. 4. Large complex cystic lesion within the mid right kidney. Further evaluation with MRI (without and with IV contrast) is recommended to exclude the presence of an underlying neoplastic process. 5. Absent left kidney. 6. 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 7. Large bilateral hydroceles. 8. Aortic atherosclerosis. Aortic Atherosclerosis (ICD10-I70.0). Electronically Signed   By: Virgina Norfolk M.D.   On: 12/15/2021 02:58     Impression/Recommendation:  61 y.o. male with a 9 mm obstructing right UPJ calculus and a solitary kidney Significant pyuria/bacteriuria and mild leukocytosis.  Stable vital signs and afebrile Recommend cystoscopy with placement right ureteral stent this morning.  We discussed no attempt will be made to remove the stone due to the possibility of an infection which could lead to bacteremia/sepsis.  We discussed he will need follow-up ureteroscopy after the infection has been treated Stent placement was discussed in detail including potential risks of bleeding, infection/sepsis and ureteral injury.  In a small percentage of cases retrograde stent placement is not successful due to stone impaction and in that event he would need a percutaneous nephrostomy tube He has a complex cystic mass in the right kidney and will need renal mass protocol MRI in the future All questions were answered and he desires to proceed    12/15/2021, 7:34 AM  John Giovanni,  MD

## 2021-12-15 NOTE — ED Notes (Signed)
Lab called to verify critical value that night shift RN took due to it not being processed in Epic.

## 2021-12-16 ENCOUNTER — Encounter: Payer: Self-pay | Admitting: Urology

## 2021-12-16 ENCOUNTER — Inpatient Hospital Stay: Payer: Self-pay

## 2021-12-16 DIAGNOSIS — I10 Essential (primary) hypertension: Secondary | ICD-10-CM

## 2021-12-16 DIAGNOSIS — J449 Chronic obstructive pulmonary disease, unspecified: Secondary | ICD-10-CM

## 2021-12-16 DIAGNOSIS — Q600 Renal agenesis, unilateral: Secondary | ICD-10-CM

## 2021-12-16 DIAGNOSIS — N179 Acute kidney failure, unspecified: Secondary | ICD-10-CM

## 2021-12-16 DIAGNOSIS — B9620 Unspecified Escherichia coli [E. coli] as the cause of diseases classified elsewhere: Secondary | ICD-10-CM

## 2021-12-16 DIAGNOSIS — R7881 Bacteremia: Secondary | ICD-10-CM

## 2021-12-16 DIAGNOSIS — N12 Tubulo-interstitial nephritis, not specified as acute or chronic: Secondary | ICD-10-CM

## 2021-12-16 LAB — BASIC METABOLIC PANEL
Anion gap: 10 (ref 5–15)
BUN: 32 mg/dL — ABNORMAL HIGH (ref 8–23)
CO2: 22 mmol/L (ref 22–32)
Calcium: 8.2 mg/dL — ABNORMAL LOW (ref 8.9–10.3)
Chloride: 107 mmol/L (ref 98–111)
Creatinine, Ser: 2.04 mg/dL — ABNORMAL HIGH (ref 0.61–1.24)
GFR, Estimated: 36 mL/min — ABNORMAL LOW (ref >60)
Glucose, Bld: 163 mg/dL — ABNORMAL HIGH (ref 70–99)
Potassium: 3.9 mmol/L (ref 3.5–5.1)
Sodium: 139 mmol/L (ref 135–145)

## 2021-12-16 LAB — URINE CULTURE

## 2021-12-16 LAB — LACTIC ACID, PLASMA
Lactic Acid, Venous: 2.9 mmol/L (ref 0.5–1.9)
Lactic Acid, Venous: 2.4 mmol/L (ref 0.5–1.9)

## 2021-12-16 MED ORDER — CHLORHEXIDINE GLUCONATE CLOTH 2 % EX PADS
6.0000 | MEDICATED_PAD | Freq: Every day | CUTANEOUS | Status: DC
  Administered 2021-12-16 – 2021-12-18 (×3): 6 via TOPICAL

## 2021-12-16 NOTE — Progress Notes (Signed)
PROGRESS NOTE  Willie White ZOX:[id number] DOB: 04-Apr-1961 DOA: 12/15/2021 PCP: Pcp, No  Brief History   Willie White is a 61 y.o. male with medical history significant for  hypertension, COPD not on home oxygen, solitary right kidney who presented to [facility] ED on 12/15/2021 with complaints of severe suprapubic abdominal pain worse on the right. He also states that he has been unable to urinate. He has had nausea and vomiting, but no fever or chills.    On CT the patient was found to have a 9 mm obstructing renal calculus at eht right UPH with numerous 1-2 mm nonobstructing right renal calculi. There was also perinephric inflammatory fat stranding. MRI is recommended to rule out presence of an underlying neoplastic process. The left kidney is absent. There are bilateral low-attenuation adrenal masses consistent with adrenal adenomas. There are large bilateral hydroceles. There is also ASCVD.   Urology was consulted and the patient underwent cystoscopy with right ureteral stent this morning.  The patient has a single kidney. Baseline creatinine appears to be 1.2. Creatinine upon presentation was 1.26. Creatinine this morning following cystoscopy and ureteral stent placement is 2.04. Nephrology has been consulted.   Urine culture and 1/2 blood cultures has grown out E. Coli. The patient is receiving Rocephin. Awaiting susceptibilities.  Consultants  Urology Nephrology  Procedures  Cystoscopy with ureteral stent placement  Antibiotics   Anti-infectives (From admission, onward)    Start     Dose/Rate Route Frequency Ordered Stop   12/16/21 0900  cefTRIAXone (ROCEPHIN) 1 g in sodium chloride 0.9 % 100 mL IVPB  Status:  Discontinued        1 g 200 mL/hr over 30 Minutes Intravenous Every 24 hours 12/15/21 0854 12/15/21 1553   12/16/21 0700  cefTRIAXone (ROCEPHIN) 2 g in sodium chloride 0.9 % 100 mL IVPB        2 g 200 mL/hr over 30 Minutes Intravenous Every 24 hours 12/15/21 1553      12/15/21 0615  cefTRIAXone (ROCEPHIN) 2 g in sodium chloride 0.9 % 100 mL IVPB        2 g 200 mL/hr over 30 Minutes Intravenous  Once 12/15/21 0609 12/15/21 0722      Subjective  The patient is resting comfortably. No new complaints.  Objective   Vitals:  Vitals:   12/16/21 1047 12/16/21 1704  BP: 131/82 (!) 147/92  Pulse: 89 95  Resp: 20   Temp: 98.5 F (36.9 C) 98.7 F (37.1 C)  SpO2: 97% 97%    Exam:  Constitutional:  The patient is awake, alert, and oriented x 3. No acute distress. Respiratory:  No increased work of breathing. No wheezes, rales, or rhonchi No tactile fremitus Cardiovascular:  Regular rate and rhythm No murmurs, ectopy, or gallups. No lateral PMI. No thrills. Abdomen:  Abdomen is soft, non-tender, non-distended No hernias, masses, or organomegaly Normoactive bowel sounds.  Musculoskeletal:  No cyanosis, clubbing, or edema Skin:  No rashes, lesions, ulcers palpation of skin: no induration or nodules Neurologic:  CN 2-12 intact Sensation all 4 extremities intact Psychiatric:  Mental status Mood, affect appropriate Orientation to person, place, time  judgment and insight appear intact  I have personally reviewed the following:   Today's Data  Vitals  Lab Data  CBC BMP  Micro Data  1/2 Blood cultures + E.coli Urine culture + E. coli  Imaging  CT renal stone study.  Cardiology Data  EKG  Other Data    Scheduled Meds:  Chlorhexidine Gluconate Cloth  6 each Topical Q0600   nicotine  21 mg Transdermal Daily   oxybutynin  10 mg Oral Daily   Continuous Infusions:  sodium chloride 125 mL/hr at 12/16/21 1445   cefTRIAXone (ROCEPHIN)  IV 2 g (12/16/21 0602)    Principal Problem:   Acute unilateral obstructive uropathy Active Problems:   Pyelonephritis   COPD (chronic obstructive pulmonary disease) ([place])   Hypertension   AKI (acute kidney injury) ([place])   LOS: 1 day   A & P  Assessment and Plan: * Acute unilateral  obstructive uropathy S/P cystoscopy and right ureteral stent placement. I appreciate urology's assistance.  E coli bacteremia 1/2 blood cultures obtained on 12/15/2021 has grown out E. Coli. Awaiting susceptibilities. The patient is receiving Rocephin IV.   AKI (acute kidney injury) ([place]) Baseline creatinine appears to be 1.2. Creatinine upon admission with obstructive uropathy was 1.36. Following cystoscopy and placement of ureteral stent creatinine is increased to 2.04. Renal ultrasound demonstrated correct placement of stent. Nephrology has been consulted as the patient has a single kidney.  Hypertension The patient is normotensive without antihypertensive therapy. It does not appear that he is on anything at home.   COPD (chronic obstructive pulmonary disease) ([place]) Noted. At baseline. The patient has rhonchi throughout his lung fields. Will make albuterol available as needed.  Pyelonephritis The patient is receiving rocephin 2 gm IV daily. 1/2 blood cultures has grown out E.coli. Urine culture has grown out E. Coli. Susceptibilities to follow. Will repeat blood cultures tomorrow.   I have seen and examined this patient myself. I have spent 34 minute in his evaluation and care.  DVT prophylaxis: SCD's Code Status: Full Code Family Communication: None available Disposition Plan: Home    Olukemi Panchal, DO [facility] Direct contact: see [email or web address]  7PM-7AM contact night coverage as above 12/16/2021, 6:13 PM  LOS: 1 day

## 2021-12-16 NOTE — Assessment & Plan Note (Addendum)
Baseline creatinine appears to be 1.2. Creatinine upon admission with obstructive uropathy was 1.36. Following cystoscopy and placement of ureteral stent creatinine is increased to 2.04. Renal ultrasound demonstrated correct placement of stent. Creatinine is down to 1.68 today.

## 2021-12-16 NOTE — Progress Notes (Signed)
Urology Inpatient Progress Note  Subjective: No acute events overnight.  He is afebrile, VSS. Creatinine up today, 2.04. Admission and Intra-Op urine cultures pending, blood cultures positive for E. coli susceptibilities pending.  On antibiotics as below. Foley catheter in place draining clear, yellow urine. Today he reports discomfort in his diffuse low back and hips.  He states he has a hard time staying still at home due to pain in this region if he sits for too long.  He reports urinary frequency overnight.  He is a snorer but has not ever had a sleep study.  He does not have a PCP or pulmonologist.  He does have a known history of COPD, not on home oxygen, and states he uses his inhalers "when he has a prescription."  Anti-infectives: Anti-infectives (From admission, onward)    Start     Dose/Rate Route Frequency Ordered Stop   12/16/21 0900  cefTRIAXone (ROCEPHIN) 1 g in sodium chloride 0.9 % 100 mL IVPB  Status:  Discontinued        1 g 200 mL/hr over 30 Minutes Intravenous Every 24 hours 12/15/21 0854 12/15/21 1553   12/16/21 0700  cefTRIAXone (ROCEPHIN) 2 g in sodium chloride 0.9 % 100 mL IVPB        2 g 200 mL/hr over 30 Minutes Intravenous Every 24 hours 12/15/21 1553     12/15/21 0615  cefTRIAXone (ROCEPHIN) 2 g in sodium chloride 0.9 % 100 mL IVPB        2 g 200 mL/hr over 30 Minutes Intravenous  Once 12/15/21 0609 12/15/21 0722       Current Facility-Administered Medications  Medication Dose Route Frequency Provider Last Rate Last Admin   0.9 %  sodium chloride infusion   Intravenous Continuous Ward, Kristen N, DO 125 mL/hr at 12/16/21 0641 Infusion Verify at 12/16/21 0641   acetaminophen (TYLENOL) tablet 650 mg  650 mg Oral Q4H PRN Swayze, Ava, DO       cefTRIAXone (ROCEPHIN) 2 g in sodium chloride 0.9 % 100 mL IVPB  2 g Intravenous Q24H Swayze, Ava, DO 200 mL/hr at 12/16/21 0602 2 g at 12/16/21 0602   Chlorhexidine Gluconate Cloth 2 % PADS 6 each  6 each Topical Q0600  Swayze, Ava, DO   6 each at 12/16/21 0600   HYDROmorphone (DILAUDID) injection 0.5-1 mg  0.5-1 mg Intravenous Q2H PRN Swayze, Ava, DO   1 mg at 12/16/21 6834   ipratropium-albuterol (DUONEB) 0.5-2.5 (3) MG/3ML nebulizer solution 3 mL  3 mL Nebulization Q4H PRN Sharion Settler, NP   3 mL at 12/16/21 0608   nicotine (NICODERM CQ - dosed in mg/24 hours) patch 21 mg  21 mg Transdermal Daily Swayze, Ava, DO   21 mg at 12/15/21 1854   oxybutynin (DITROPAN-XL) 24 hr tablet 10 mg  10 mg Oral Daily Swayze, Ava, DO       oxyCODONE (Oxy IR/ROXICODONE) immediate release tablet 5 mg  5 mg Oral Q4H PRN Swayze, Ava, DO   5 mg at 12/15/21 1844   senna-docusate (Senokot-S) tablet 1 tablet  1 tablet Oral QHS PRN Swayze, Ava, DO       sorbitol 70 % solution 30 mL  30 mL Oral Daily PRN Swayze, Ava, DO       Objective: Vital signs in last 24 hours: Temp:  [97.7 F (36.5 C)-100.9 F (38.3 C)] 98.3 F (36.8 C) (08/17 0637) Pulse Rate:  [75-87] 84 (08/17 0637) Resp:  [18-28] 18 (08/17 0637) BP: (94-144)/(60-85)  115/83 (08/17 3299) SpO2:  [94 %-98 %] 95 % (08/17 0637) Weight:  [88.9 kg] 88.9 kg (08/16 0956)  Intake/Output from previous day: 08/16 0701 - 08/17 0700 In: 5106.9 [P.O.:1865; I.V.:3141.9; IV Piggyback:100] Out: 4850 [Urine:4850] Intake/Output this shift: No intake/output data recorded.  Physical Exam Vitals and nursing note reviewed.  Constitutional:      General: He is not in acute distress.    Appearance: He is not ill-appearing, toxic-appearing or diaphoretic.  HENT:     Head: Normocephalic and atraumatic.  Pulmonary:     Effort: Pulmonary effort is normal. No respiratory distress.  Skin:    General: Skin is warm and dry.  Neurological:     Mental Status: He is alert and oriented to person, place, and time.  Psychiatric:        Mood and Affect: Mood normal.        Behavior: Behavior normal.    Lab Results:  Recent Labs    12/15/21 0204  WBC 16.9*  HGB 13.9  HCT 43.0  PLT  494*   BMET Recent Labs    12/15/21 0204 12/16/21 0408  NA 139 139  K 3.6 3.9  CL 104 107  CO2 27 22  GLUCOSE 156* 163*  BUN 21 32*  CREATININE 1.39* 2.04*  CALCIUM 9.0 8.2*   Studies/Results: DG OR UROLOGY CYSTO IMAGE ([facility] ONLY)  Result Date: 12/15/2021 There is no interpretation for this exam.  This order is for images obtained during a surgical procedure.  Please See "Surgeries" Tab for more information regarding the procedure.   CT Renal Stone Study  Result Date: 12/15/2021 CLINICAL DATA:  Right-sided abdominal pain and nausea. EXAM: CT ABDOMEN AND PELVIS WITHOUT CONTRAST TECHNIQUE: Multidetector CT imaging of the abdomen and pelvis was performed following the standard protocol without IV contrast. RADIATION DOSE REDUCTION: This exam was performed according to the departmental dose-optimization program which includes automated exposure control, adjustment of the mA and/or kV according to patient size and/or use of iterative reconstruction technique. COMPARISON:  None Available. FINDINGS: Lower chest: No acute abnormality. Hepatobiliary: No focal liver abnormality is seen. No gallstones, gallbladder wall thickening, or biliary dilatation. Pancreas: Unremarkable. No pancreatic ductal dilatation or surrounding inflammatory changes. Spleen: Normal in size without focal abnormality. Adrenals/Urinary Tract: A 2.7 cm x 2.4 cm low-attenuation (approximately -12.08 Hounsfield units) right adrenal mass is seen. A similar appearing 1.2 cm x 1.3 cm low-attenuation (approximately 5.93 Hounsfield units) left adrenal mass is also noted. The left kidney is not visualized. The right kidney is enlarged. A 4.2 cm x 3.2 cm x 5.2 cm complex cystic appearing area is seen within the mid right kidney. Numerous 1 mm and 2 mm nonobstructing renal calculi are seen throughout the right kidney. A 9 mm obstructing renal calculus is seen at the right UPJ, with moderate severity right-sided hydronephrosis and moderate  to marked severity right-sided perinephric inflammatory fat stranding. The urinary bladder is partially contracted and subsequently limited in evaluation. Mild diffuse urinary bladder wall thickening is seen. Stomach/Bowel: Stomach is within normal limits. Appendix appears normal. No evidence of bowel wall thickening, distention, or inflammatory changes. Vascular/Lymphatic: Aortic atherosclerosis. No enlarged abdominal or pelvic lymph nodes. Reproductive: A mild-to-moderate amount of parenchymal calcification is seen within a mildly enlarged prostate gland. Large bilateral hydroceles are seen. Other: No abdominal wall hernia or abnormality. No abdominopelvic ascites. Musculoskeletal: Multilevel degenerative changes seen throughout the lumbar spine. This is most prominent at the level of L5-S1. IMPRESSION: 1. 9 mm obstructing  renal calculus at the right UPJ. 2. Numerous 1 mm and 2 mm nonobstructing right renal calculi. 3. Perinephric inflammatory fat stranding which may be, in part, secondary to partial obstruction of the right kidney. Sequelae associated with superimposed acute pyelonephritis cannot be excluded. 4. Large complex cystic lesion within the mid right kidney. Further evaluation with MRI (without and with IV contrast) is recommended to exclude the presence of an underlying neoplastic process. 5. Absent left kidney. 6. 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 7. Large bilateral hydroceles. 8. Aortic atherosclerosis. Aortic Atherosclerosis (ICD10-I70.0). Electronically Signed   By: Virgina Norfolk M.D.   On: 12/15/2021 02:58    Assessment & Plan: 61 year old male with COPD and a solitary right kidney s/p right ureteral stent placement with Dr. Bernardo Heater for management of an obstructing right UPJ calculus with pyonephrosis.  Creatinine is up today despite stent placement and with Foley catheter in place.  Foley appears to be draining well.  Will obtain renal  ultrasound today to ensure that stent is functioning appropriately.  We discussed that with his solitary kidney and uptrending creatinine, I do not think it would be appropriate to send him home today.  We will continue to monitor his renal function.  He will require a total of 14 days of culture appropriate antibiotics to clear his infection.  We discussed the need for outpatient ureteroscopy with laser lithotripsy and stent exchange with Dr. Bernardo Heater in 2 to 3 weeks.  Surgical scheduler, Lenna Sciara, will call him after discharge to arrange.  With intraoperative findings of bladder stones, bladder neck elevation, and mild lateral prostatic lobe enlargement but he may ultimately benefit from an outlet procedure.  We discussed that undiagnosed sleep apnea may be leading to his nocturia.  I recommended that he establish care with a PCP especially given his COPD history.  Debroah Loop, PA-C 12/16/2021

## 2021-12-16 NOTE — Assessment & Plan Note (Addendum)
1/2 blood cultures obtained on 12/15/2021 has grown out E. Coli. The organism is  The patient is receiving Rocephin IV. Surveillance cultures were obtained and have had no growth. Infectious disease was consulted.  He will be discharged to home on oral ciprofloxacin.

## 2021-12-16 NOTE — TOC Initial Note (Signed)
Transition of Care [facility]) - Initial/Assessment Note    Patient Details  Name: Willie White MRN: [id number] Date of Birth: 1960/09/29  [facility] CM/SW Contact:    Beverly Sessions, RN Phone Number: 12/16/2021, 11:08 AM  Clinical Narrative:                  Admitted for: cystoscopy with right ureteral stent  Admitted from: Home alone PCP: No PCP Pharmacy: [facility]  Current home health/prior home health/DME:NA  Patient is uninsured.  Provided "[facility]:  Your Guide to [employer] [facility] [employer] [facility]"  Booklet, and application to [employer].  TOC available if patient needs medication assistance at discharge    Expected Discharge Plan: Home/Self Care     Patient Goals and CMS Choice        Expected Discharge Plan and Services Expected Discharge Plan: Home/Self Care       Living arrangements for the past 2 months: Single Family Home                                      Prior Living Arrangements/Services Living arrangements for the past 2 months: Single Family Home Lives with:: Self                   Activities of Daily Living Home Assistive Devices/Equipment: Eyeglasses ADL Screening (condition at time of admission) Patient's cognitive ability adequate to safely complete daily activities?: Yes Is the patient deaf or have difficulty hearing?: No Does the patient have difficulty seeing, even when wearing glasses/contacts?: No Does the patient have difficulty concentrating, remembering, or making decisions?: No Patient able to express need for assistance with ADLs?: Yes Does the patient have difficulty dressing or bathing?: No Independently performs ADLs?: Yes (appropriate for developmental age) Does the patient have difficulty walking or climbing stairs?: No Weakness of Legs: None Weakness of Arms/Hands: None  Permission Sought/Granted                  Emotional Assessment              Admission  diagnosis:  Kidney stone [N20.0] Pyelonephritis [N12] Acute unilateral obstructive uropathy [N13.9] Patient Active Problem List   Diagnosis Date Noted   Acute unilateral obstructive uropathy 12/15/2021   Pyelonephritis 12/15/2021   COPD (chronic obstructive pulmonary disease) ([place])    Hypertension    PCP:  Pcp, No Pharmacy:   [facility] [address] [place] ([place]) [place] [address] Phone: [phone] Fax: [phone]     Social Determinants of Health (SDOH) Interventions    Readmission Risk Interventions     No data to display

## 2021-12-16 NOTE — Plan of Care (Signed)
  Problem: Clinical Measurements: Goal: Diagnostic test results will improve Outcome: Not Progressing   Problem: Coping: Goal: Level of anxiety will decrease Outcome: Not Progressing   

## 2021-12-16 NOTE — Progress Notes (Signed)
Mobility Specialist - Progress Note    12/16/21 1301  Mobility  Activity Ambulated independently in room;Stood at bedside;Dangled on edge of bed  Level of Assistance Standby assist, set-up cues, supervision of patient - no hands on  Assistive Device None  Distance Ambulated (ft) 40 ft  Activity Response Tolerated well  $Mobility charge 1 Mobility   Pt semi-supine on RA upon arrival. Pt STS and ambulates around room indep. Pt does some standing stretches of the arm/back indep. Pt returns to bed with needs in reach.   Gretchen Short  Mobility Specialist  12/16/21 1:02 PM

## 2021-12-17 ENCOUNTER — Inpatient Hospital Stay: Payer: Self-pay

## 2021-12-17 DIAGNOSIS — N136 Pyonephrosis: Secondary | ICD-10-CM

## 2021-12-17 LAB — BASIC METABOLIC PANEL
Anion gap: 5 (ref 5–15)
BUN: 28 mg/dL — ABNORMAL HIGH (ref 8–23)
CO2: 22 mmol/L (ref 22–32)
Calcium: 8.2 mg/dL — ABNORMAL LOW (ref 8.9–10.3)
Chloride: 110 mmol/L (ref 98–111)
Creatinine, Ser: 1.68 mg/dL — ABNORMAL HIGH (ref 0.61–1.24)
GFR, Estimated: 46 mL/min — ABNORMAL LOW (ref >60)
Glucose, Bld: 116 mg/dL — ABNORMAL HIGH (ref 70–99)
Potassium: 3.9 mmol/L (ref 3.5–5.1)
Sodium: 137 mmol/L (ref 135–145)

## 2021-12-17 LAB — CULTURE, BLOOD (ROUTINE X 2)
Special Requests: ADEQUATE
Special Requests: ADEQUATE

## 2021-12-17 LAB — CBC WITH DIFFERENTIAL/PLATELET
Abs Immature Granulocytes: 0.25 10*3/uL — ABNORMAL HIGH (ref 0.00–0.07)
Basophils Absolute: 0 10*3/uL (ref 0.0–0.1)
Basophils Relative: 0 %
Eosinophils Absolute: 0.1 10*3/uL (ref 0.0–0.5)
Eosinophils Relative: 1 %
HCT: 36.2 % — ABNORMAL LOW (ref 39.0–52.0)
Hemoglobin: 12 g/dL — ABNORMAL LOW (ref 13.0–17.0)
Immature Granulocytes: 2 %
Lymphocytes Relative: 5 %
Lymphs Abs: 0.8 10*3/uL (ref 0.7–4.0)
MCH: 31.7 pg (ref 26.0–34.0)
MCHC: 33.1 g/dL (ref 30.0–36.0)
MCV: 95.5 fL (ref 80.0–100.0)
Monocytes Absolute: 0.7 10*3/uL (ref 0.1–1.0)
Monocytes Relative: 4 %
Neutro Abs: 14.8 10*3/uL — ABNORMAL HIGH (ref 1.7–7.7)
Neutrophils Relative %: 88 %
Platelets: 310 10*3/uL (ref 150–400)
RBC: 3.79 MIL/uL — ABNORMAL LOW (ref 4.22–5.81)
RDW: 13.6 % (ref 11.5–15.5)
WBC: 16.8 10*3/uL — ABNORMAL HIGH (ref 4.0–10.5)
nRBC: 0 % (ref 0.0–0.2)

## 2021-12-17 LAB — URINE CULTURE: Culture: >=100000 — AB

## 2021-12-17 MED ORDER — SODIUM CHLORIDE 0.9 % IV BOLUS
1000.0000 mL | Freq: Once | INTRAVENOUS | Status: AC
  Administered 2021-12-17: 1000 mL via INTRAVENOUS

## 2021-12-17 MED ORDER — CIPROFLOXACIN HCL 500 MG PO TABS
500.0000 mg | ORAL_TABLET | Freq: Two times a day (BID) | ORAL | 0 refills | Status: AC
Start: 2021-12-17 — End: 2021-12-28

## 2021-12-17 MED ORDER — LEVOFLOXACIN IN D5W 750 MG/150ML IV SOLN
750.0000 mg | INTRAVENOUS | Status: DC
  Administered 2021-12-18: 750 mg via INTRAVENOUS
  Filled 2021-12-17: qty 150

## 2021-12-17 MED ORDER — LEVOFLOXACIN IN D5W 750 MG/150ML IV SOLN: 750.0000 mg | INTRAVENOUS | Status: DC

## 2021-12-17 MED ORDER — OXYCODONE HCL 5 MG PO TABS: 5.0000 mg | ORAL_TABLET | ORAL | 0 refills | Status: DC | PRN

## 2021-12-17 MED ORDER — POLYETHYLENE GLYCOL 3350 17 G PO PACK: 17.0000 g | PACK | Freq: Every day | ORAL | 0 refills | Status: DC

## 2021-12-17 MED ORDER — IPRATROPIUM-ALBUTEROL 0.5-2.5 (3) MG/3ML IN SOLN
3.0000 mL | Freq: Four times a day (QID) | RESPIRATORY_TRACT | Status: DC
  Administered 2021-12-17 – 2021-12-18 (×3): 3 mL via RESPIRATORY_TRACT
  Filled 2021-12-17 (×3): qty 3

## 2021-12-17 MED ORDER — CEFAZOLIN SODIUM-DEXTROSE 2-4 GM/100ML-% IV SOLN: 2.0000 g | Freq: Three times a day (TID) | INTRAVENOUS | Status: DC

## 2021-12-17 MED ORDER — NICOTINE 21 MG/24HR TD PT24: 21.0000 mg | MEDICATED_PATCH | Freq: Every day | TRANSDERMAL | 0 refills | Status: AC

## 2021-12-17 MED ORDER — OXYBUTYNIN CHLORIDE ER 10 MG PO TB24: 10.0000 mg | ORAL_TABLET | Freq: Every day | ORAL | 0 refills | Status: DC

## 2021-12-17 MED ORDER — METOPROLOL TARTRATE 25 MG PO TABS
25.0000 mg | ORAL_TABLET | Freq: Two times a day (BID) | ORAL | Status: DC
  Administered 2021-12-17 – 2021-12-18 (×3): 25 mg via ORAL
  Filled 2021-12-17 (×3): qty 1

## 2021-12-17 MED ORDER — METOPROLOL TARTRATE 25 MG PO TABS: 25.0000 mg | ORAL_TABLET | Freq: Two times a day (BID) | ORAL | 0 refills | Status: AC

## 2021-12-17 NOTE — Progress Notes (Signed)
This RT was called to pt room for Breathing Tx. Upon entering pt appeared in distress with increased WOB. Pt stated that when he returned from bathroom he was more short of breath than he has been before. Pt sat 87 on room air. Pt tol nebulizer well. RN stated she will remove nebulizer mask. This RT asked her to place him on O2 after tx.

## 2021-12-17 NOTE — Consult Note (Signed)
[facility]  CONSULT NOTE    Date: 12/17/2021                  Patient Name:  Willie White  MRN: [id number]  DOB: [birth year]  Age / Sex: 61 y.o., male         PCP: Pcp, No                 Service Requesting Consult: [place]                 Reason for Consult: Acute kidney injury            History of Present Illness: Mr. Willie White is a 61 y.o.  male with past medical conditions of hypertension, COPD, and solitary right kidney, who was admitted to [facility] on 12/15/2021 for Kidney stone [N20.0] Pyelonephritis [N12] Acute unilateral obstructive uropathy [N13.9]  Patient presented to the emergency department with complaints of abdominal pain and inability to urinate.  Patient states this problem began the same day.  He was unable to urinate and began drinking large amounts of water over 2 hours to stimulate urination.  Patient states he began to vomit up water and was still unable to urinate.  Increased abdominal pain caused him to seek assistance.  No previous nausea or vomiting reported prior to that incident.6 denies NSAID use.  Periodically uses Tylenol for minor aches and pains.  Does not currently follow with nephrology.  No complaints of chest pain.  Labs on ED arrival significant for glucose 156, creatinine 1.39 with GFR 57, and elevated WBC 16.9.  Lactic acid 3.2.  UA with hematuria and proteinuria.  Blood cultures positive for E. coli.  CT renal stone shows a 9 mm obstructive stone in the right upper pole and numerous 1 to 2 cm nonobstructing stones.  There is also a large complex 60 lesion within the right mid kidney.  Confirms absence of left kidney.   Medications: Outpatient medications: No medications prior to admission.    Current medications: Current Facility-Administered Medications  Medication Dose Route Frequency Provider Last Rate Last Admin   0.9 %  sodium chloride infusion   Intravenous Continuous Ward, Kristen N, DO 125 mL/hr at 12/17/21  0750 New Bag at 12/17/21 0750   acetaminophen (TYLENOL) tablet 650 mg  650 mg Oral Q4H PRN Swayze, Ava, DO       [START ON 12/18/2021] ceFAZolin (ANCEF) IVPB 2g/100 mL premix  2 g Intravenous Q8H Beers, Shanon Brow, RPH       Chlorhexidine Gluconate Cloth 2 % PADS 6 each  6 each Topical Q0600 Swayze, Ava, DO   6 each at 12/17/21 0906   HYDROmorphone (DILAUDID) injection 0.5-1 mg  0.5-1 mg Intravenous Q2H PRN Swayze, Ava, DO   1 mg at 12/16/21 1942   ipratropium-albuterol (DUONEB) 0.5-2.5 (3) MG/3ML nebulizer solution 3 mL  3 mL Nebulization Q4H PRN Sharion Settler, NP   3 mL at 12/16/21 0608   metoprolol tartrate (LOPRESSOR) tablet 25 mg  25 mg Oral BID Swayze, Ava, DO   25 mg at 12/17/21 1244   nicotine (NICODERM CQ - dosed in mg/24 hours) patch 21 mg  21 mg Transdermal Daily Swayze, Ava, DO   21 mg at 12/17/21 0902   oxybutynin (DITROPAN-XL) 24 hr tablet 10 mg  10 mg Oral Daily Swayze, Ava, DO   10 mg at 12/17/21 6962   oxyCODONE (Oxy IR/ROXICODONE) immediate release tablet 5 mg  5 mg Oral  Q4H PRN Swayze, Ava, DO   5 mg at 12/17/21 0903   senna-docusate (Senokot-S) tablet 1 tablet  1 tablet Oral QHS PRN Swayze, Ava, DO       sorbitol 70 % solution 30 mL  30 mL Oral Daily PRN Swayze, Ava, DO          Allergies: Allergies  Allergen Reactions   Codeine Other (See Comments)    Tremors      Past Medical History: Past Medical History:  Diagnosis Date   COPD (chronic obstructive pulmonary disease) ([place])    Hypertension      Past Surgical History: Past Surgical History:  Procedure Laterality Date   CYSTOSCOPY WITH URETEROSCOPY AND STENT PLACEMENT Right 12/15/2021   Procedure: CYSTOSCOPY WITH RIGHT URETERAL STENT PLACEMENT;  Surgeon: Abbie Sons, MD;  Location: [facility] ORS;  Service: Urology;  Laterality: Right;   HAND SURGERY Left    INNER EAR SURGERY Left      Family History: History reviewed. No pertinent family history.   Social History: Social History   Socioeconomic  History   Marital status: Widowed    Spouse name: Not on file   Number of children: Not on file   Years of education: Not on file   Highest education level: Not on file  Occupational History   Not on file  Tobacco Use   Smoking status: Every Day    Packs/day: 1.00    Types: Cigarettes   Smokeless tobacco: Never  Vaping Use   Vaping Use: Never used  Substance and Sexual Activity   Alcohol use: Yes    Comment: Occassional    Drug use: No   Sexual activity: Not on file  Other Topics Concern   Not on file  Social History Narrative   Not on file   Social Determinants of Health   Financial Resource Strain: Not on file  Food Insecurity: Not on file  Transportation Needs: Not on file  Physical Activity: Not on file  Stress: Not on file  Social Connections: Not on file  Intimate Partner Violence: Not on file     Review of Systems: Review of Systems  Constitutional:  Negative for chills, fever and malaise/fatigue.  HENT:  Negative for congestion, sore throat and tinnitus.   Eyes:  Negative for blurred vision and redness.  Respiratory:  Negative for cough, shortness of breath and wheezing.   Cardiovascular:  Negative for chest pain, palpitations, claudication and leg swelling.  Gastrointestinal:  Positive for abdominal pain and vomiting. Negative for blood in stool, diarrhea and nausea.  Genitourinary:  Positive for dysuria. Negative for flank pain, frequency and hematuria.  Musculoskeletal:  Negative for back pain, falls and myalgias.  Skin:  Negative for rash.  Neurological:  Negative for dizziness, weakness and headaches.  Endo/Heme/Allergies:  Does not bruise/bleed easily.  Psychiatric/Behavioral:  Negative for depression. The patient is not nervous/anxious and does not have insomnia.     Vital Signs: Blood pressure (!) 160/105, pulse 85, temperature 99.2 F (37.3 C), resp. rate 18, height '5\' 8"'$  (1.727 m), weight 88.9 kg, SpO2 98 %.  Weight trends: Filed Weights    12/15/21 0203 12/15/21 0956  Weight: 88.9 kg 88.9 kg    Physical Exam: General: NAD, resting comfortably  Head: Normocephalic, atraumatic. Moist oral mucosal membranes  Eyes: Anicteric  Lungs:  Clear to auscultation, normal effort  Heart: Regular rate and rhythm  Abdomen:  Soft, nontender, distended  Extremities: No peripheral edema.  Neurologic: Nonfocal, moving all four  extremities  Skin: No lesions  Access: None     Lab results: Basic Metabolic Panel: Recent Labs  Lab 12/15/21 0204 12/16/21 0408 12/17/21 0626  NA 139 139 137  K 3.6 3.9 3.9  CL 104 107 110  CO2 '27 22 22  '$ GLUCOSE 156* 163* 116*  BUN 21 32* 28*  CREATININE 1.39* 2.04* 1.68*  CALCIUM 9.0 8.2* 8.2*    Liver Function Tests: Recent Labs  Lab 12/15/21 0204  AST 23  ALT 23  ALKPHOS 60  BILITOT 0.3  PROT 8.3*  ALBUMIN 3.7   Recent Labs  Lab 12/15/21 0204  LIPASE 42   No results for input(s): "AMMONIA" in the last 168 hours.  CBC: Recent Labs  Lab 12/15/21 0204 12/17/21 0626  WBC 16.9* 16.8*  NEUTROABS  --  14.8*  HGB 13.9 12.0*  HCT 43.0 36.2*  MCV 97.5 95.5  PLT 494* 310    Cardiac Enzymes: No results for input(s): "CKTOTAL", "CKMB", "CKMBINDEX", "TROPONINI" in the last 168 hours.  BNP: Invalid input(s): "POCBNP"  CBG: No results for input(s): "GLUCAP" in the last 168 hours.  Microbiology: Results for orders placed or performed during the hospital encounter of 12/15/21  Urine Culture     Status: Abnormal   Collection Time: 12/15/21  2:04 AM   Specimen: Urine, Clean Catch  Result Value Ref Range Status   Specimen Description   Final    URINE, CLEAN CATCH Performed at [facility], [address], [place], [place] [address]    Special Requests   Final    NONE Performed at [facility], [address] [place], [place] [address]    Culture >=100,000 COLONIES/mL ESCHERICHIA COLI (A)  Final   Report Status 12/17/2021 FINAL  Final   Organism ID,  Bacteria ESCHERICHIA COLI (A)  Final      Susceptibility   Escherichia coli - MIC*    AMPICILLIN <=2 SENSITIVE Sensitive     CEFAZOLIN <=4 SENSITIVE Sensitive     CEFEPIME <=0.12 SENSITIVE Sensitive     CEFTRIAXONE <=0.25 SENSITIVE Sensitive     CIPROFLOXACIN <=0.25 SENSITIVE Sensitive     GENTAMICIN <=1 SENSITIVE Sensitive     IMIPENEM <=0.25 SENSITIVE Sensitive     NITROFURANTOIN <=16 SENSITIVE Sensitive     TRIMETH/SULFA <=20 SENSITIVE Sensitive     AMPICILLIN/SULBACTAM <=2 SENSITIVE Sensitive     PIP/TAZO <=4 SENSITIVE Sensitive     * >=100,000 COLONIES/mL ESCHERICHIA COLI  Blood culture (routine x 2)     Status: Abnormal   Collection Time: 12/15/21  5:56 AM   Specimen: BLOOD  Result Value Ref Range Status   Specimen Description   Final    BLOOD RIGHT UPPER ARM Performed at [facility], [address] [place], [place] [address]    Special Requests   Final    BOTTLES DRAWN AEROBIC AND ANAEROBIC Blood Culture adequate volume Performed at [facility], [address] [place], [place] [address]    Culture  Setup Time   Final    GRAM NEGATIVE RODS IN BOTH AEROBIC AND ANAEROBIC BOTTLES CRITICAL VALUE NOTED.  VALUE IS CONSISTENT WITH PREVIOUSLY REPORTED AND CALLED VALUE. Performed at [facility], [address] [place], [place] [address]    Culture (A)  Final    ESCHERICHIA COLI SUSCEPTIBILITIES PERFORMED ON PREVIOUS CULTURE WITHIN THE LAST 5 DAYS. Performed at [facility], [place] [address], [place], [place] [address]    Report Status 12/17/2021 FINAL  Final  Blood culture (routine x 2)     Status: Abnormal   Collection Time: 12/15/21  5:57 AM   Specimen: BLOOD  Result Value Ref Range Status   Specimen Description   Final    BLOOD LEFT AC Performed at [facility], [address], [place], [place] [address]    Special Requests   Final    BOTTLES DRAWN AEROBIC AND ANAEROBIC Blood Culture adequate volume Performed at  [facility], [address], [place], [place] [address]    Culture  Setup Time   Final    GRAM NEGATIVE RODS IN BOTH AEROBIC AND ANAEROBIC BOTTLES CRITICAL RESULT CALLED TO, READ BACK BY AND VERIFIED WITH: BRANDON BEERS 1523 12/15/21 MU Performed at [facility], [place] [address] [place], [place] [address]    Culture ESCHERICHIA COLI (A)  Final   Report Status 12/17/2021 FINAL  Final   Organism ID, Bacteria ESCHERICHIA COLI  Final      Susceptibility   Escherichia coli - MIC*    AMPICILLIN <=2 SENSITIVE Sensitive     CEFAZOLIN <=4 SENSITIVE Sensitive     CEFEPIME <=0.12 SENSITIVE Sensitive     CEFTAZIDIME <=1 SENSITIVE Sensitive     CEFTRIAXONE <=0.25 SENSITIVE Sensitive     CIPROFLOXACIN <=0.25 SENSITIVE Sensitive     GENTAMICIN <=1 SENSITIVE Sensitive     IMIPENEM <=0.25 SENSITIVE Sensitive     TRIMETH/SULFA <=20 SENSITIVE Sensitive     AMPICILLIN/SULBACTAM <=2 SENSITIVE Sensitive     PIP/TAZO <=4 SENSITIVE Sensitive     * ESCHERICHIA COLI  Blood Culture ID Panel (Reflexed)     Status: Abnormal   Collection Time: 12/15/21  5:57 AM  Result Value Ref Range Status   Enterococcus faecalis NOT DETECTED NOT DETECTED Final   Enterococcus Faecium NOT DETECTED NOT DETECTED Final   Listeria monocytogenes NOT DETECTED NOT DETECTED Final   Staphylococcus species NOT DETECTED NOT DETECTED Final   Staphylococcus aureus (BCID) NOT DETECTED NOT DETECTED Final   Staphylococcus epidermidis NOT DETECTED NOT DETECTED Final   Staphylococcus lugdunensis NOT DETECTED NOT DETECTED Final   Streptococcus species NOT DETECTED NOT DETECTED Final   Streptococcus agalactiae NOT DETECTED NOT DETECTED Final   Streptococcus pneumoniae NOT DETECTED NOT DETECTED Final   Streptococcus pyogenes NOT DETECTED NOT DETECTED Final   A.calcoaceticus-baumannii NOT DETECTED NOT DETECTED Final   Bacteroides fragilis NOT DETECTED NOT DETECTED Final   Enterobacterales DETECTED (A) NOT DETECTED Final     Comment: Enterobacterales represent a large order of gram negative bacteria, not a single organism. CRITICAL RESULT CALLED TO, READ BACK BY AND VERIFIED WITH: BRANDON BEERS 1523 12/15/21 MU    Enterobacter cloacae complex NOT DETECTED NOT DETECTED Final   Escherichia coli DETECTED (A) NOT DETECTED Final    Comment: CRITICAL RESULT CALLED TO, READ BACK BY AND VERIFIED WITH: BRANDON BEERS 1523 12/15/21 MU    Klebsiella aerogenes NOT DETECTED NOT DETECTED Final   Klebsiella oxytoca NOT DETECTED NOT DETECTED Final   Klebsiella pneumoniae NOT DETECTED NOT DETECTED Final   Proteus species NOT DETECTED NOT DETECTED Final   Salmonella species NOT DETECTED NOT DETECTED Final   Serratia marcescens NOT DETECTED NOT DETECTED Final   Haemophilus influenzae NOT DETECTED NOT DETECTED Final   Neisseria meningitidis NOT DETECTED NOT DETECTED Final   Pseudomonas aeruginosa NOT DETECTED NOT DETECTED Final   Stenotrophomonas maltophilia NOT DETECTED NOT DETECTED Final   Candida albicans NOT DETECTED NOT DETECTED Final   Candida auris NOT DETECTED NOT DETECTED  Final   Candida glabrata NOT DETECTED NOT DETECTED Final   Candida krusei NOT DETECTED NOT DETECTED Final   Candida parapsilosis NOT DETECTED NOT DETECTED Final   Candida tropicalis NOT DETECTED NOT DETECTED Final   Cryptococcus neoformans/gattii NOT DETECTED NOT DETECTED Final   CTX-M ESBL NOT DETECTED NOT DETECTED Final   Carbapenem resistance IMP NOT DETECTED NOT DETECTED Final   Carbapenem resistance KPC NOT DETECTED NOT DETECTED Final   Carbapenem resistance NDM NOT DETECTED NOT DETECTED Final   Carbapenem resist OXA 48 LIKE NOT DETECTED NOT DETECTED Final   Carbapenem resistance VIM NOT DETECTED NOT DETECTED Final    Comment: Performed at [facility], [address], [place], [place] [address]  Urine Culture     Status: Abnormal   Collection Time: 12/15/21 11:20 AM   Specimen: Urine, Cystoscope  Result Value Ref Range Status    Specimen Description   Final    URINE, RANDOM RIGHT RENAL PELVIC Performed at [facility], [place] [address], [place], [place] [address]    Special Requests   Final    NONE Performed at [facility], [address] [place], [place] [address]    Culture MULTIPLE SPECIES PRESENT, SUGGEST RECOLLECTION (A)  Final   Report Status 12/16/2021 FINAL  Final    Coagulation Studies: No results for input(s): "LABPROT", "INR" in the last 72 hours.  Urinalysis: Recent Labs    12/15/21 0240  COLORURINE AMBER*  LABSPEC 1.011  PHURINE 8.0  GLUCOSEU NEGATIVE  HGBUR LARGE*  BILIRUBINUR NEGATIVE  KETONESUR NEGATIVE  PROTEINUR 100*  NITRITE NEGATIVE  LEUKOCYTESUR LARGE*      Imaging: DG Abd 1 View  Result Date: 12/16/2021 CLINICAL DATA:  Acute kidney injury EXAM: ABDOMEN - 1 VIEW COMPARISON:  CT abdomen pelvis 12/15/2020 3 FINDINGS: Right ureteral stent in good position. 8 mm calculus overlying the right renal pelvis unchanged from the prior CT. No left renal calculi. Nonobstructive bowel gas pattern. No dilated bowel loops. No acute skeletal abnormality. IMPRESSION: Right ureteral stent in good position. No change in 8 mm calculus overlying the right renal pelvis. Electronically Signed   By: Franchot Gallo M.D.   On: 12/16/2021 12:56   US RENAL  Result Date: 12/16/2021 CLINICAL DATA:  AKI EXAM: RENAL / URINARY TRACT ULTRASOUND COMPLETE COMPARISON:  December 15, 2021, September 30, 2020 FINDINGS: Right Kidney: Renal measurements: 16.5 x 9.1 x 9.3 = volume: 731 mL. Increased echogenicity of the right kidney. Mild hydronephrosis, with a focally dilated interpolar calyx. Shadowing stone seen at the level of the right UPJ, measuring up to 11 mm. Focal echogenic mass-like area in the interpolar right kidney measuring 7.1 x 6.8 x 5.2 cm , corresponding with the complex cystic area seen on prior CT. Left Kidney: The left kidney is not visualized. Bladder: Decompressed with a Foley in place. Other:  None. IMPRESSION: 1. Obstructive 11 mm stone in the right UPJ with resulting mild hydronephrosis. 2. Focal echogenic mass-like area in the interpolar right kidney measuring up to 7.1 cm and corresponding with the complex cystic area seen on CT. Differential considerations include focal pyelonephritis/abscess or a neoplastic process. After course of acute illness, consider renal MRI with contrast for further assessment. Electronically Signed   By: Beryle Flock M.D.   On: 12/16/2021 10:32     Assessment & Plan: Mr. KEIMON BASALDUA is a 61 y.o.  male with  Mr. ZEN CEDILLOS is a 61 y.o.  male with past medical  conditions of hypertension, COPD, and solitary right kidney, who was admitted to [facility] on 12/15/2021 for Kidney stone [N20.0] Pyelonephritis [N12] Acute unilateral obstructive uropathy [N13.9]  Acute kidney injury with hematuria/proteinuria.  Baseline unknown.  Solitary right kidney only since birth.  No IV contrast exposure.  CT renal stone shows 9 mm obstructive stone on the right kidney.  Urology consulted and performed cystoscopy with urethral stent placement.  Expect renal function to improve with resolution of obstruction.  We will continue to monitor.  Encourage patient to continue to avoid NSAIDs.  Continue to avoid nephrotoxic agents and therapies.  Patient will need follow-up in our office in 1 to 2 weeks at discharge.  2.  Hypertension, essential.  No antihypertensives noted on home medications.  Patient prescribed metoprolol.  Blood pressure remains elevated 160/105.  3.  Bacteremia, E. coli noted on 1 blood culture sample.  Receiving IV Rocephin.   LOS: 2 Nanci Lakatos 8/18/20232:46 PM

## 2021-12-17 NOTE — Consult Note (Signed)
NAME: Willie White  DOB: [birth year]  MRN: [id number]  Date/Time: 12/17/2021 3:20 PM  REQUESTING PROVIDER: Dr.Swayze Subjective:  REASON FOR CONSULT: E.coli bacteremia and complicated UTi ? Willie White is a 61 y.o. with a history of COPD, solitary rt kidney presented to the ED on 8/16/with suprapubic pain, rt flank pain and difficulty in passing urine of a few hours duration .  12/15/21  BP 113/81  Temp 97.7 F (36.5 C)  Pulse Rate 75  Resp 19  SpO2 96 %  O2 Flow Rate (L/min) 2 L/min    Latest Reference Range & Units 12/15/21  WBC 4.0 - 10.5 K/uL 16.9 (H)  Hemoglobin 13.0 - 17.0 g/dL 13.9  HCT 39.0 - 52.0 % 43.0  Platelets 150 - 400 K/uL 494 (H)  Creatinine 0.61 - 1.24 mg/dL 1.39 (H)  Blood culture and urine culture sent and started on IV ceftriaxone  CT scan showed Obstructing Rt PUJ . HE underwent cystoscopy and stent placement on 12/15/21 Blood culture  and urine culture came positive for e.coli -pan sensitive . I am asked to see patient for antibiotic recommendation Pt smokes 1PPD Also drinks alcohol Pt is feeling better H/o UTI in June 2022 No insurance  Does not take any meds for HTN or COPD Baseline cough Wants to go home as his 2 dogs will run out of food- He has no family .    Past Medical History:  Diagnosis Date   COPD (chronic obstructive pulmonary disease) ([place])    Hypertension     Past Surgical History:  Procedure Laterality Date   CYSTOSCOPY WITH URETEROSCOPY AND STENT PLACEMENT Right 12/15/2021   Procedure: CYSTOSCOPY WITH RIGHT URETERAL STENT PLACEMENT;  Surgeon: Abbie Sons, MD;  Location: [facility];  Service: Urology;  Laterality: Right;   HAND SURGERY Left    INNER EAR SURGERY Left     Social History   Socioeconomic History   Marital status: Widowed    Spouse name: Not on file   Number of children: Not on file   Years of education: Not on file   Highest education level: Not on file  Occupational History   Not on file  Tobacco Use    Smoking status: Every Day    Packs/day: 1.00    Types: Cigarettes   Smokeless tobacco: Never  Vaping Use   Vaping Use: Never used  Substance and Sexual Activity   Alcohol use: Yes    Comment: Occassional    Drug use: No   Sexual activity: Not on file  Other Topics Concern   Not on file  Social History Narrative   Not on file   Social Determinants of Health   Financial Resource Strain: Not on file  Food Insecurity: Not on file  Transportation Needs: Not on file  Physical Activity: Not on file  Stress: Not on file  Social Connections: Not on file  Intimate Partner Violence: Not on file    History reviewed. No pertinent family history. Allergies  Allergen Reactions   Codeine Other (See Comments)    Tremors   I? Current Facility-Administered Medications  Medication Dose Route Frequency Provider Last Rate Last Admin   0.9 %  sodium chloride infusion   Intravenous Continuous Ward, Kristen N, DO 125 mL/hr at 12/17/21 0750 New Bag at 12/17/21 0750   acetaminophen (TYLENOL) tablet 650 mg  650 mg Oral Q4H PRN Swayze, Ava, DO       [START ON 12/18/2021] ceFAZolin (ANCEF) IVPB 2g/100 mL premix  2 g Intravenous Q8H Beers, Shanon Brow, RPH       Chlorhexidine Gluconate Cloth 2 % PADS 6 each  6 each Topical Q0600 Swayze, Ava, DO   6 each at 12/17/21 0906   HYDROmorphone (DILAUDID) injection 0.5-1 mg  0.5-1 mg Intravenous Q2H PRN Swayze, Ava, DO   1 mg at 12/16/21 1942   ipratropium-albuterol (DUONEB) 0.5-2.5 (3) MG/3ML nebulizer solution 3 mL  3 mL Nebulization Q4H PRN Sharion Settler, NP   3 mL at 12/16/21 0608   metoprolol tartrate (LOPRESSOR) tablet 25 mg  25 mg Oral BID Swayze, Ava, DO   25 mg at 12/17/21 1244   nicotine (NICODERM CQ - dosed in mg/24 hours) patch 21 mg  21 mg Transdermal Daily Swayze, Ava, DO   21 mg at 12/17/21 0902   oxybutynin (DITROPAN-XL) 24 hr tablet 10 mg  10 mg Oral Daily Swayze, Ava, DO   10 mg at 12/17/21 1194   oxyCODONE (Oxy IR/ROXICODONE) immediate  release tablet 5 mg  5 mg Oral Q4H PRN Swayze, Ava, DO   5 mg at 12/17/21 1740   senna-docusate (Senokot-S) tablet 1 tablet  1 tablet Oral QHS PRN Swayze, Ava, DO       sorbitol 70 % solution 30 mL  30 mL Oral Daily PRN Swayze, Ava, DO         Abtx:  Anti-infectives (From admission, onward)    Start     Dose/Rate Route Frequency Ordered Stop   12/18/21 0600  ceFAZolin (ANCEF) IVPB 2g/100 mL premix        2 g 200 mL/hr over 30 Minutes Intravenous Every 8 hours 12/17/21 1035     12/16/21 0900  cefTRIAXone (ROCEPHIN) 1 g in sodium chloride 0.9 % 100 mL IVPB  Status:  Discontinued        1 g 200 mL/hr over 30 Minutes Intravenous Every 24 hours 12/15/21 0854 12/15/21 1553   12/16/21 0700  cefTRIAXone (ROCEPHIN) 2 g in sodium chloride 0.9 % 100 mL IVPB  Status:  Discontinued        2 g 200 mL/hr over 30 Minutes Intravenous Every 24 hours 12/15/21 1553 12/17/21 1035   12/15/21 0615  cefTRIAXone (ROCEPHIN) 2 g in sodium chloride 0.9 % 100 mL IVPB        2 g 200 mL/hr over 30 Minutes Intravenous  Once 12/15/21 0609 12/15/21 8144       REVIEW OF SYSTEMS:  Const: negative fever, negative chills, negative weight loss Eyes: negative diplopia or visual changes, negative eye pain ENT: negative coryza, negative sore throat Resp: baseline cough, no hemoptysis, some dyspnea Cards: negative for chest pain, palpitations, lower extremity edema GU:as above GI: Negative for abdominal pain, diarrhea, bleeding, constipation Skin: negative for rash and pruritus Heme: negative for easy bruising and gum/nose bleeding MS: negative for myalgias, arthralgias, back pain and muscle weakness Neurolo:negative for headaches, dizziness, vertigo, memory problems  Psych: negative for feelings of anxiety, depression  Endocrine: negative for thyroid, diabetes Allergy/Immunology- codeine Objective:  VITALS:  BP (!) 160/105   Pulse 85   Temp 99.2 F (37.3 C)   Resp 18   Ht '5\' 8"'$  (1.727 m)   Wt 88.9 kg   SpO2  98%   BMI 29.80 kg/m   PHYSICAL EXAM:  General: Alert, cooperative, has nasal oxygen,   Head: Normocephalic, without obvious abnormality, atraumatic. Eyes: Conjunctivae clear, anicteric sclerae. Pupils are equal ENT Nares normal. No drainage or sinus tenderness. Lips, mucosa, and tongue normal.  No Thrush Neck: Supple, symmetrical, no adenopathy, thyroid: non tender no carotid bruit and no JVD. Back: No CVA tenderness. Lungs: b/l air entry-  Heart: Regular rate and rhythm, no murmur, rub or gallop. Abdomen: Soft,  obese. Bowel sounds normal. No masses Extremities: atraumatic, no cyanosis. No edema. No clubbing Skin: No rashes or lesions. Or bruising Lymph: Cervical, supraclavicular normal. Neurologic: Grossly non-focal Pertinent Labs Lab Results CBC    Component Value Date/Time   WBC 16.8 (H) 12/17/2021 0626   RBC 3.79 (L) 12/17/2021 0626   HGB 12.0 (L) 12/17/2021 0626   HGB 15.8 12/24/2012 1929   HCT 36.2 (L) 12/17/2021 0626   HCT 45.1 12/24/2012 1929   PLT 310 12/17/2021 0626   PLT 154 12/24/2012 1929   MCV 95.5 12/17/2021 0626   MCV 93 12/24/2012 1929   MCH 31.7 12/17/2021 0626   MCHC 33.1 12/17/2021 0626   RDW 13.6 12/17/2021 0626   RDW 13.5 12/24/2012 1929   LYMPHSABS 0.8 12/17/2021 0626   LYMPHSABS 1.0 12/24/2012 1929   MONOABS 0.7 12/17/2021 0626   MONOABS 1.3 (H) 12/24/2012 1929   EOSABS 0.1 12/17/2021 0626   EOSABS 0.0 12/24/2012 1929   BASOSABS 0.0 12/17/2021 0626   BASOSABS 0.0 12/24/2012 1929       Latest Ref Rng & Units 12/17/2021    6:26 AM 12/16/2021    4:08 AM 12/15/2021    2:04 AM  CMP  Glucose 70 - 99 mg/dL 116  163  156   BUN 8 - 23 mg/dL 28  32  21   Creatinine 0.61 - 1.24 mg/dL 1.68  2.04  1.39   Sodium 135 - 145 mmol/L 137  139  139   Potassium 3.5 - 5.1 mmol/L 3.9  3.9  3.6   Chloride 98 - 111 mmol/L 110  107  104   CO2 22 - 32 mmol/L '22  22  27   '$ Calcium 8.9 - 10.3 mg/dL 8.2  8.2  9.0   Total Protein 6.5 - 8.1 g/dL   8.3   Total  Bilirubin 0.3 - 1.2 mg/dL   0.3   Alkaline Phos 38 - 126 U/L   60   AST 15 - 41 U/L   23   ALT 0 - 44 U/L   23       Microbiology: Recent Results (from the past 240 hour(s))  Urine Culture     Status: Abnormal   Collection Time: 12/15/21  2:04 AM   Specimen: Urine, Clean Catch  Result Value Ref Range Status   Specimen Description   Final    URINE, CLEAN CATCH Performed at [facility], [address], [place], [place] [address]    Special Requests   Final    NONE Performed at [facility], [address] [place], [place] [address]    Culture >=100,000 COLONIES/mL ESCHERICHIA COLI (A)  Final   Report Status 12/17/2021 FINAL  Final   Organism ID, Bacteria ESCHERICHIA COLI (A)  Final      Susceptibility   Escherichia coli - MIC*    AMPICILLIN <=2 SENSITIVE Sensitive     CEFAZOLIN <=4 SENSITIVE Sensitive     CEFEPIME <=0.12 SENSITIVE Sensitive     CEFTRIAXONE <=0.25 SENSITIVE Sensitive     CIPROFLOXACIN <=0.25 SENSITIVE Sensitive     GENTAMICIN <=1 SENSITIVE Sensitive     IMIPENEM <=0.25 SENSITIVE Sensitive     NITROFURANTOIN <=16 SENSITIVE Sensitive     TRIMETH/SULFA <=20 SENSITIVE Sensitive  AMPICILLIN/SULBACTAM <=2 SENSITIVE Sensitive     PIP/TAZO <=4 SENSITIVE Sensitive     * >=100,000 COLONIES/mL ESCHERICHIA COLI  Blood culture (routine x 2)     Status: Abnormal   Collection Time: 12/15/21  5:56 AM   Specimen: BLOOD  Result Value Ref Range Status   Specimen Description   Final    BLOOD RIGHT UPPER ARM Performed at [facility], [address] [place], [place] [address]    Special Requests   Final    BOTTLES DRAWN AEROBIC AND ANAEROBIC Blood Culture adequate volume Performed at [facility], [address] [place], [place] [address]    Culture  Setup Time   Final    GRAM NEGATIVE RODS IN BOTH AEROBIC AND ANAEROBIC BOTTLES CRITICAL VALUE NOTED.  VALUE IS CONSISTENT WITH PREVIOUSLY REPORTED AND CALLED VALUE. Performed  at [facility], [address] [place], [place] [address]    Culture (A)  Final    ESCHERICHIA COLI SUSCEPTIBILITIES PERFORMED ON PREVIOUS CULTURE WITHIN THE LAST 5 DAYS. Performed at [facility], [place] [address], [place], [place] [address]    Report Status 12/17/2021 FINAL  Final  Blood culture (routine x 2)     Status: Abnormal   Collection Time: 12/15/21  5:57 AM   Specimen: BLOOD  Result Value Ref Range Status   Specimen Description   Final    BLOOD LEFT AC Performed at [facility], [address], [place], [place] [address]    Special Requests   Final    BOTTLES DRAWN AEROBIC AND ANAEROBIC Blood Culture adequate volume Performed at [employer] [place], [place] [address]    Culture  Setup Time   Final    GRAM NEGATIVE RODS IN BOTH AEROBIC AND ANAEROBIC BOTTLES CRITICAL RESULT CALLED TO, READ BACK BY AND VERIFIED WITH: BRANDON BEERS 1523 12/15/21 MU Performed at [facility], [place] [address], [place],  [address]    Culture ESCHERICHIA COLI (A)  Final   Report Status 12/17/2021 FINAL  Final   Organism ID, Bacteria ESCHERICHIA COLI  Final      Susceptibility   Escherichia coli - MIC*    AMPICILLIN <=2 SENSITIVE Sensitive     CEFAZOLIN <=4 SENSITIVE Sensitive     CEFEPIME <=0.12 SENSITIVE Sensitive     CEFTAZIDIME <=1 SENSITIVE Sensitive     CEFTRIAXONE <=0.25 SENSITIVE Sensitive     CIPROFLOXACIN <=0.25 SENSITIVE Sensitive     GENTAMICIN <=1 SENSITIVE Sensitive     IMIPENEM <=0.25 SENSITIVE Sensitive     TRIMETH/SULFA <=20 SENSITIVE Sensitive     AMPICILLIN/SULBACTAM <=2 SENSITIVE Sensitive     PIP/TAZO <=4 SENSITIVE Sensitive     * ESCHERICHIA COLI  Blood Culture ID Panel (Reflexed)     Status: Abnormal   Collection Time: 12/15/21  5:57 AM  Result Value Ref Range Status   Enterococcus faecalis NOT DETECTED NOT DETECTED Final   Enterococcus Faecium NOT DETECTED NOT DETECTED Final   Listeria  monocytogenes NOT DETECTED NOT DETECTED Final   Staphylococcus species NOT DETECTED NOT DETECTED Final   Staphylococcus aureus (BCID) NOT DETECTED NOT DETECTED Final   Staphylococcus epidermidis NOT DETECTED NOT DETECTED Final   Staphylococcus lugdunensis NOT DETECTED NOT DETECTED Final   Streptococcus species NOT DETECTED NOT DETECTED Final   Streptococcus agalactiae NOT DETECTED NOT DETECTED Final   Streptococcus pneumoniae NOT DETECTED NOT DETECTED Final   Streptococcus pyogenes NOT DETECTED NOT DETECTED Final   A.calcoaceticus-baumannii  NOT DETECTED NOT DETECTED Final   Bacteroides fragilis NOT DETECTED NOT DETECTED Final   Enterobacterales DETECTED (A) NOT DETECTED Final    Comment: Enterobacterales represent a large order of gram negative bacteria, not a single organism. CRITICAL RESULT CALLED TO, READ BACK BY AND VERIFIED WITH: BRANDON BEERS 1523 12/15/21 MU    Enterobacter cloacae complex NOT DETECTED NOT DETECTED Final   Escherichia coli DETECTED (A) NOT DETECTED Final    Comment: CRITICAL RESULT CALLED TO, READ BACK BY AND VERIFIED WITH: BRANDON BEERS 1523 12/15/21 MU    Klebsiella aerogenes NOT DETECTED NOT DETECTED Final   Klebsiella oxytoca NOT DETECTED NOT DETECTED Final   Klebsiella pneumoniae NOT DETECTED NOT DETECTED Final   Proteus species NOT DETECTED NOT DETECTED Final   Salmonella species NOT DETECTED NOT DETECTED Final   Serratia marcescens NOT DETECTED NOT DETECTED Final   Haemophilus influenzae NOT DETECTED NOT DETECTED Final   Neisseria meningitidis NOT DETECTED NOT DETECTED Final   Pseudomonas aeruginosa NOT DETECTED NOT DETECTED Final   Stenotrophomonas maltophilia NOT DETECTED NOT DETECTED Final   Candida albicans NOT DETECTED NOT DETECTED Final   Candida auris NOT DETECTED NOT DETECTED Final   Candida glabrata NOT DETECTED NOT DETECTED Final   Candida krusei NOT DETECTED NOT DETECTED Final   Candida parapsilosis NOT DETECTED NOT DETECTED Final   Candida  tropicalis NOT DETECTED NOT DETECTED Final   Cryptococcus neoformans/gattii NOT DETECTED NOT DETECTED Final   CTX-M ESBL NOT DETECTED NOT DETECTED Final   Carbapenem resistance IMP NOT DETECTED NOT DETECTED Final   Carbapenem resistance KPC NOT DETECTED NOT DETECTED Final   Carbapenem resistance NDM NOT DETECTED NOT DETECTED Final   Carbapenem resist OXA 48 LIKE NOT DETECTED NOT DETECTED Final   Carbapenem resistance VIM NOT DETECTED NOT DETECTED Final    Comment: Performed at [facility], [address] [place], [place] [address]  Urine Culture     Status: Abnormal   Collection Time: 12/15/21 11:20 AM   Specimen: Urine, Cystoscope  Result Value Ref Range Status   Specimen Description   Final    URINE, RANDOM RIGHT RENAL PELVIC Performed at [facility], [address] [place], [place] [address]    Special Requests   Final    NONE Performed at [facility], [address] [place],  [address]    Culture MULTIPLE SPECIES PRESENT, SUGGEST RECOLLECTION (A)  Final   Report Status 12/16/2021 FINAL  Final    IMAGING RESULTS:  I have personally reviewed the films Rt kidney enlarged 77m obstructing RT PUJ calculi Large complex cystic lesion in the mid right kidney  Impression/Recommendation ?[age]yr male E.coli bacteremia with sepsis secondary to complicated UTI.  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 While on cipro do not prescribe PO steroids Rare Side effects explained including myalgia, tendinitis  tendon rupture follow up  with urology ? ? ___________________________________________________ Discussed with patient, requesting provider Note:  This document was prepared using Dragon voice recognition software and may include unintentional  dictation errors.

## 2021-12-17 NOTE — Discharge Summary (Signed)
Physician Discharge Summary   Patient: Willie White MRN: [id number] DOB: [birth year]  Admit date:     12/15/2021  Discharge date: 12/18/21  Discharge Physician: Karie Kirks   PCP: Pcp, No   Recommendations at discharge:    Discharge to home Follow up with urology in 2-3 weeks. Follow up with PCP in 7-10 days. Patient should have CBC and BMP drawn on visit with PCP to be reported to PCP.  Discharge Diagnoses: Principal Problem:   Acute unilateral obstructive uropathy Active Problems:   Pyelonephritis   COPD (chronic obstructive pulmonary disease) ([place])   Hypertension   AKI (acute kidney injury) ([place])   E coli bacteremia   Acute bronchitis  Resolved Problems:   * No resolved hospital problems. *  Hospital Course: Willie White is a 61 y.o. male with medical history significant for  hypertension, COPD not on home oxygen, solitary right kidney who presented to [facility] ED on 12/15/2021 with complaints of severe suprapubic abdominal pain worse on the right. He also states that he has been unable to urinate. He has had nausea and vomiting, but no fever or chills.    On CT the patient was found to have a 9 mm obstructing renal calculus at eht right UPH with numerous 1-2 mm nonobstructing right renal calculi. There was also perinephric inflammatory fat stranding. MRI is recommended to rule out presence of an underlying neoplastic process. The left kidney is absent. There are bilateral low-attenuation adrenal masses consistent with adrenal adenomas. There are large bilateral hydroceles. There is also ASCVD.   Urology was consulted and the patient underwent cystoscopy with right ureteral stent this morning.   The patient has a single kidney. Baseline creatinine appears to be 1.2. Creatinine upon presentation was 1.26. Creatinine this morning following cystoscopy and ureteral stent placement is 2.04. Nephrology has been consulted.    Urine culture and 1/2 blood cultures has grown out E. Coli.  The patient is receiving Rocephin. The organism is pan-sensitive.  Surveillance cultures have been drawn. Infectious disease has been consulted and recommendation is for the patient to stay for another day to allow for confirmation that control of the infection had been achieved before the patient was converted to oral antibiotics. However, the patient is unwilling to stay another day. He will be discharged to home on cipro 500 mg bid. I appreciate Dr. Gwenevere Ghazi assistance.  Assessment and Plan: * Acute unilateral obstructive uropathy S/P cystoscopy and right ureteral stent placement. I appreciate urology's assistance. The patient is to follow up with urology as outpatient in 2-3 weeks as directed.  Acute bronchitis While patient was preparing to discharge on 12/17/2021, he was noted to be hypoxic as he was moving about the room. The patient's oxygen saturations recovered to the mid 90's on 2L O2. He was given round the clock breathing treatments. He was tested for influenza and COVID-19. Bother were negative. A general viral respiratory panel was also ordered, but has not resulted yet. The patient is demanding to leave. I do not feel that this represents a bacterial infection as the patient has had no fevers or elevation of WBC. CXR is indicative of bronchitis. He will be discharged to home with an inhaler. He is saturating into the 90's now on room air.  E coli bacteremia 1/2 blood cultures obtained on 12/15/2021 has grown out E. Coli. The organism is  The patient is receiving Rocephin IV. Surveillance cultures were obtained and have had no growth. Infectious disease was consulted.  He will be discharged to home on oral ciprofloxacin.   AKI (acute kidney injury) ([place]) Baseline creatinine appears to be 1.2. Creatinine upon admission with obstructive uropathy was 1.36. Following cystoscopy and placement of ureteral stent creatinine is increased to 2.04. Renal ultrasound demonstrated correct  placement of stent. Creatinine is down to 1.68 today.  Hypertension The patient is normotensive without antihypertensive therapy. It does not appear that he is on anything at home.   COPD (chronic obstructive pulmonary disease) ([place]) Noted. At baseline. The patient has rhonchi throughout his lung fields. Will make albuterol available as needed.  Pyelonephritis The patient is receiving rocephin 2 gm IV daily. 1/2 blood cultures has grown out E.coli. Urine culture has grown out E. Coli. E. Coli is pan-sensitive. Will repeat blood cultures tomorrow.The patient has been converted to cipro for discharge to home.   Consultants: Infectious disease, urology Procedures performed: Cystoscopy with ureteral stent.  Disposition: Home Diet recommendation:  Cardiac diet DISCHARGE MEDICATION: Allergies as of 12/18/2021       Reactions   Codeine Other (See Comments)   Tremors        Medication List     TAKE these medications    albuterol 108 (90 Base) MCG/ACT inhaler Commonly known as: VENTOLIN HFA Inhale 2 puffs into the lungs every 6 (six) hours as needed for wheezing or shortness of breath.   ciprofloxacin 500 MG tablet Commonly known as: Cipro Take 1 tablet (500 mg total) by mouth 2 (two) times daily for 11 days.   metoprolol tartrate 25 MG tablet Commonly known as: LOPRESSOR Take 1 tablet (25 mg total) by mouth 2 (two) times daily.   nicotine 21 mg/24hr patch Commonly known as: NICODERM CQ - dosed in mg/24 hours Place 1 patch (21 mg total) onto the skin daily.   oxybutynin 10 MG 24 hr tablet Commonly known as: DITROPAN-XL Take 1 tablet (10 mg total) by mouth daily.   oxyCODONE 5 MG immediate release tablet Commonly known as: Oxy IR/ROXICODONE Take 1 tablet (5 mg total) by mouth every 4 (four) hours as needed for moderate pain.   polyethylene glycol 17 g packet Commonly known as: MiraLax Take 17 g by mouth daily.        Discharge Exam: Filed Weights   12/15/21 0203  12/15/21 0956  Weight: 88.9 kg 88.9 kg   Exam:  Constitutional:  The patient is awake, alert, and oriented x 3. No acute distress. Respiratory:  No increased work of breathing. No wheezes, rales, or rhonchi No tactile fremitus Cardiovascular:  Regular rate and rhythm No murmurs, ectopy, or gallups. No lateral PMI. No thrills. Abdomen:  Abdomen is soft, non-tender, non-distended No hernias, masses, or organomegaly Normoactive bowel sounds.  Musculoskeletal:  No cyanosis, clubbing, or edema Skin:  No rashes, lesions, ulcers palpation of skin: no induration or nodules Neurologic:  CN 2-12 intact Sensation all 4 extremities intact Psychiatric:  Mental status Mood, affect appropriate Orientation to person, place, time  judgment and insight appear intact   Condition at discharge: fair  The results of significant diagnostics from this hospitalization (including imaging, microbiology, ancillary and laboratory) are listed below for reference.   Imaging Studies: DG Chest Port 1 View  Result Date: 12/17/2021 CLINICAL DATA:  Hypoxia EXAM: PORTABLE CHEST 1 VIEW COMPARISON:  09/30/2020 FINDINGS: Cardiac size is within normal limits. There is peribronchial thickening. There is no focal consolidation. There is minimal blunting of left lateral CP angle. There is no pneumothorax. There is a metallic density overlying  the left side of mediastinum, most likely residual from previous trauma. IMPRESSION: There is prominence of interstitial markings in perihilar regions suggesting bronchitis or mild interstitial edema. There is no focal pulmonary consolidation. Minimal left pleural effusion. Electronically Signed   By: Elmer Picker M.D.   On: 12/17/2021 18:18   DG Abd 1 View  Result Date: 12/16/2021 CLINICAL DATA:  Acute kidney injury EXAM: ABDOMEN - 1 VIEW COMPARISON:  CT abdomen pelvis 12/15/2020 3 FINDINGS: Right ureteral stent in good position. 8 mm calculus overlying the right renal  pelvis unchanged from the prior CT. No left renal calculi. Nonobstructive bowel gas pattern. No dilated bowel loops. No acute skeletal abnormality. IMPRESSION: Right ureteral stent in good position. No change in 8 mm calculus overlying the right renal pelvis. Electronically Signed   By: Franchot Gallo M.D.   On: 12/16/2021 12:56   US RENAL  Result Date: 12/16/2021 CLINICAL DATA:  AKI EXAM: RENAL / URINARY TRACT ULTRASOUND COMPLETE COMPARISON:  December 15, 2021, September 30, 2020 FINDINGS: Right Kidney: Renal measurements: 16.5 x 9.1 x 9.3 = volume: 731 mL. Increased echogenicity of the right kidney. Mild hydronephrosis, with a focally dilated interpolar calyx. Shadowing stone seen at the level of the right UPJ, measuring up to 11 mm. Focal echogenic mass-like area in the interpolar right kidney measuring 7.1 x 6.8 x 5.2 cm , corresponding with the complex cystic area seen on prior CT. Left Kidney: The left kidney is not visualized. Bladder: Decompressed with a Foley in place. Other: None. IMPRESSION: 1. Obstructive 11 mm stone in the right UPJ with resulting mild hydronephrosis. 2. Focal echogenic mass-like area in the interpolar right kidney measuring up to 7.1 cm and corresponding with the complex cystic area seen on CT. Differential considerations include focal pyelonephritis/abscess or a neoplastic process. After course of acute illness, consider renal MRI with contrast for further assessment. Electronically Signed   By: Beryle Flock M.D.   On: 12/16/2021 10:32   DG OR UROLOGY CYSTO IMAGE ([facility] ONLY)  Result Date: 12/15/2021 There is no interpretation for this exam.  This order is for images obtained during a surgical procedure.  Please See "Surgeries" Tab for more information regarding the procedure.   CT Renal Stone Study  Result Date: 12/15/2021 CLINICAL DATA:  Right-sided abdominal pain and nausea. EXAM: CT ABDOMEN AND PELVIS WITHOUT CONTRAST TECHNIQUE: Multidetector CT imaging of the abdomen and  pelvis was performed following the standard protocol without IV contrast. RADIATION DOSE REDUCTION: This exam was performed according to the departmental dose-optimization program which includes automated exposure control, adjustment of the mA and/or kV according to patient size and/or use of iterative reconstruction technique. COMPARISON:  None Available. FINDINGS: Lower chest: No acute abnormality. Hepatobiliary: No focal liver abnormality is seen. No gallstones, gallbladder wall thickening, or biliary dilatation. Pancreas: Unremarkable. No pancreatic ductal dilatation or surrounding inflammatory changes. Spleen: Normal in size without focal abnormality. Adrenals/Urinary Tract: A 2.7 cm x 2.4 cm low-attenuation (approximately -12.08 Hounsfield units) right adrenal mass is seen. A similar appearing 1.2 cm x 1.3 cm low-attenuation (approximately 5.93 Hounsfield units) left adrenal mass is also noted. The left kidney is not visualized. The right kidney is enlarged. A 4.2 cm x 3.2 cm x 5.2 cm complex cystic appearing area is seen within the mid right kidney. Numerous 1 mm and 2 mm nonobstructing renal calculi are seen throughout the right kidney. A 9 mm obstructing renal calculus is seen at the right UPJ, with moderate severity right-sided hydronephrosis and  moderate to marked severity right-sided perinephric inflammatory fat stranding. The urinary bladder is partially contracted and subsequently limited in evaluation. Mild diffuse urinary bladder wall thickening is seen. Stomach/Bowel: Stomach is within normal limits. Appendix appears normal. No evidence of bowel wall thickening, distention, or inflammatory changes. Vascular/Lymphatic: Aortic atherosclerosis. No enlarged abdominal or pelvic lymph nodes. Reproductive: A mild-to-moderate amount of parenchymal calcification is seen within a mildly enlarged prostate gland. Large bilateral hydroceles are seen. Other: No abdominal wall hernia or abnormality. No  abdominopelvic ascites. Musculoskeletal: Multilevel degenerative changes seen throughout the lumbar spine. This is most prominent at the level of L5-S1. IMPRESSION: 1. 9 mm obstructing renal calculus at the right UPJ. 2. Numerous 1 mm and 2 mm nonobstructing right renal calculi. 3. Perinephric inflammatory fat stranding which may be, in part, secondary to partial obstruction of the right kidney. Sequelae associated with superimposed acute pyelonephritis cannot be excluded. 4. Large complex cystic lesion within the mid right kidney. Further evaluation with MRI (without and with IV contrast) is recommended to exclude the presence of an underlying neoplastic process. 5. Absent left kidney. 6. 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 7. Large bilateral hydroceles. 8. Aortic atherosclerosis. Aortic Atherosclerosis (ICD10-I70.0). Electronically Signed   By: Virgina Norfolk M.D.   On: 12/15/2021 02:58    Microbiology: Results for orders placed or performed during the hospital encounter of 12/15/21  Urine Culture     Status: Abnormal   Collection Time: 12/15/21  2:04 AM   Specimen: Urine, Clean Catch  Result Value Ref Range Status   Specimen Description   Final    URINE, CLEAN CATCH Performed at [facility], [address], [place], [place] [address]    Special Requests   Final    NONE Performed at [facility], [address] [place], [place] [address]    Culture >=100,000 COLONIES/mL ESCHERICHIA COLI (A)  Final   Report Status 12/17/2021 FINAL  Final   Organism ID, Bacteria ESCHERICHIA COLI (A)  Final      Susceptibility   Escherichia coli - MIC*    AMPICILLIN <=2 SENSITIVE Sensitive     CEFAZOLIN <=4 SENSITIVE Sensitive     CEFEPIME <=0.12 SENSITIVE Sensitive     CEFTRIAXONE <=0.25 SENSITIVE Sensitive     CIPROFLOXACIN <=0.25 SENSITIVE Sensitive     GENTAMICIN <=1 SENSITIVE Sensitive     IMIPENEM <=0.25 SENSITIVE Sensitive      NITROFURANTOIN <=16 SENSITIVE Sensitive     TRIMETH/SULFA <=20 SENSITIVE Sensitive     AMPICILLIN/SULBACTAM <=2 SENSITIVE Sensitive     PIP/TAZO <=4 SENSITIVE Sensitive     * >=100,000 COLONIES/mL ESCHERICHIA COLI  Blood culture (routine x 2)     Status: Abnormal   Collection Time: 12/15/21  5:56 AM   Specimen: BLOOD  Result Value Ref Range Status   Specimen Description   Final    BLOOD RIGHT UPPER ARM Performed at [facility], [address], [place], [place] [address]    Special Requests   Final    BOTTLES DRAWN AEROBIC AND ANAEROBIC Blood Culture adequate volume Performed at [facility], [address] [place], [place] [address]    Culture  Setup Time   Final    GRAM NEGATIVE RODS IN BOTH AEROBIC AND ANAEROBIC BOTTLES CRITICAL VALUE NOTED.  VALUE IS CONSISTENT WITH PREVIOUSLY REPORTED AND CALLED VALUE. Performed at [facility], [address] [place],  [address]    Culture (A)  Final  ESCHERICHIA COLI SUSCEPTIBILITIES PERFORMED ON PREVIOUS CULTURE WITHIN THE LAST 5 DAYS. Performed at [facility], [place] [address] [place], [place] [address]    Report Status 12/17/2021 FINAL  Final  Blood culture (routine x 2)     Status: Abnormal   Collection Time: 12/15/21  5:57 AM   Specimen: BLOOD  Result Value Ref Range Status   Specimen Description   Final    BLOOD LEFT AC Performed at [facility], [address], [place], [place] [address]    Special Requests   Final    BOTTLES DRAWN AEROBIC AND ANAEROBIC Blood Culture adequate volume Performed at [place] [place], [place] [address]    Culture  Setup Time   Final    GRAM NEGATIVE RODS IN BOTH AEROBIC AND ANAEROBIC BOTTLES CRITICAL RESULT CALLED TO, READ BACK BY AND VERIFIED WITH: BRANDON BEERS 1523 12/15/21 MU Performed at [facility], [place] [address], [place], [place] [address]    Culture ESCHERICHIA COLI (A)  Final   Report Status  12/17/2021 FINAL  Final   Organism ID, Bacteria ESCHERICHIA COLI  Final      Susceptibility   Escherichia coli - MIC*    AMPICILLIN <=2 SENSITIVE Sensitive     CEFAZOLIN <=4 SENSITIVE Sensitive     CEFEPIME <=0.12 SENSITIVE Sensitive     CEFTAZIDIME <=1 SENSITIVE Sensitive     CEFTRIAXONE <=0.25 SENSITIVE Sensitive     CIPROFLOXACIN <=0.25 SENSITIVE Sensitive     GENTAMICIN <=1 SENSITIVE Sensitive     IMIPENEM <=0.25 SENSITIVE Sensitive     TRIMETH/SULFA <=20 SENSITIVE Sensitive     AMPICILLIN/SULBACTAM <=2 SENSITIVE Sensitive     PIP/TAZO <=4 SENSITIVE Sensitive     * ESCHERICHIA COLI  Blood Culture ID Panel (Reflexed)     Status: Abnormal   Collection Time: 12/15/21  5:57 AM  Result Value Ref Range Status   Enterococcus faecalis NOT DETECTED NOT DETECTED Final   Enterococcus Faecium NOT DETECTED NOT DETECTED Final   Listeria monocytogenes NOT DETECTED NOT DETECTED Final   Staphylococcus species NOT DETECTED NOT DETECTED Final   Staphylococcus aureus (BCID) NOT DETECTED NOT DETECTED Final   Staphylococcus epidermidis NOT DETECTED NOT DETECTED Final   Staphylococcus lugdunensis NOT DETECTED NOT DETECTED Final   Streptococcus species NOT DETECTED NOT DETECTED Final   Streptococcus agalactiae NOT DETECTED NOT DETECTED Final   Streptococcus pneumoniae NOT DETECTED NOT DETECTED Final   Streptococcus pyogenes NOT DETECTED NOT DETECTED Final   A.calcoaceticus-baumannii NOT DETECTED NOT DETECTED Final   Bacteroides fragilis NOT DETECTED NOT DETECTED Final   Enterobacterales DETECTED (A) NOT DETECTED Final    Comment: Enterobacterales represent a large order of gram negative bacteria, not a single organism. CRITICAL RESULT CALLED TO, READ BACK BY AND VERIFIED WITH: BRANDON BEERS 1523 12/15/21 MU    Enterobacter cloacae complex NOT DETECTED NOT DETECTED Final   Escherichia coli DETECTED (A) NOT DETECTED Final    Comment: CRITICAL RESULT CALLED TO, READ BACK BY AND VERIFIED WITH: BRANDON  BEERS 1523 12/15/21 MU    Klebsiella aerogenes NOT DETECTED NOT DETECTED Final   Klebsiella oxytoca NOT DETECTED NOT DETECTED Final   Klebsiella pneumoniae NOT DETECTED NOT DETECTED Final   Proteus species NOT DETECTED NOT DETECTED Final   Salmonella species NOT DETECTED NOT DETECTED Final   Serratia marcescens NOT DETECTED NOT DETECTED Final   Haemophilus influenzae NOT DETECTED NOT DETECTED Final   Neisseria meningitidis NOT DETECTED NOT DETECTED Final  Pseudomonas aeruginosa NOT DETECTED NOT DETECTED Final   Stenotrophomonas maltophilia NOT DETECTED NOT DETECTED Final   Candida albicans NOT DETECTED NOT DETECTED Final   Candida auris NOT DETECTED NOT DETECTED Final   Candida glabrata NOT DETECTED NOT DETECTED Final   Candida krusei NOT DETECTED NOT DETECTED Final   Candida parapsilosis NOT DETECTED NOT DETECTED Final   Candida tropicalis NOT DETECTED NOT DETECTED Final   Cryptococcus neoformans/gattii NOT DETECTED NOT DETECTED Final   CTX-M ESBL NOT DETECTED NOT DETECTED Final   Carbapenem resistance IMP NOT DETECTED NOT DETECTED Final   Carbapenem resistance KPC NOT DETECTED NOT DETECTED Final   Carbapenem resistance NDM NOT DETECTED NOT DETECTED Final   Carbapenem resist OXA 48 LIKE NOT DETECTED NOT DETECTED Final   Carbapenem resistance VIM NOT DETECTED NOT DETECTED Final    Comment: Performed at [facility], [address] [place], [place] [address]  Urine Culture     Status: Abnormal   Collection Time: 12/15/21 11:20 AM   Specimen: Urine, Cystoscope  Result Value Ref Range Status   Specimen Description   Final    URINE, RANDOM RIGHT RENAL PELVIC Performed at [facility] [address], [place], [place] [address]    Special Requests   Final    NONE Performed at [facility], [address] [place], [place] [address]    Culture MULTIPLE SPECIES PRESENT, SUGGEST RECOLLECTION (A)  Final   Report Status 12/16/2021 FINAL  Final  Culture, blood  (Routine X 2) w Reflex to ID Panel     Status: None (Preliminary result)   Collection Time: 12/17/21  9:41 AM   Specimen: BLOOD  Result Value Ref Range Status   Specimen Description BLOOD RIGHT ANTECUBITAL  Final   Special Requests   Final    BOTTLES DRAWN AEROBIC AND ANAEROBIC Blood Culture adequate volume   Culture   Final    NO GROWTH < 24 HOURS Performed at [facility], [address] [place], [place] [address]    Report Status PENDING  Incomplete  Culture, blood (Routine X 2) w Reflex to ID Panel     Status: None (Preliminary result)   Collection Time: 12/17/21  9:41 AM   Specimen: BLOOD  Result Value Ref Range Status   Specimen Description BLOOD LEFT ANTECUBITAL  Final   Special Requests   Final    BOTTLES DRAWN AEROBIC AND ANAEROBIC Blood Culture adequate volume   Culture   Final    NO GROWTH < 24 HOURS Performed at [facility], [address], [place],  [address]    Report Status PENDING  Incomplete  Resp Panel by RT-PCR (Flu A&B, Covid) Anterior Nasal Swab     Status: None   Collection Time: 12/18/21  9:38 AM   Specimen: Anterior Nasal Swab  Result Value Ref Range Status   SARS Coronavirus 2 by RT PCR NEGATIVE NEGATIVE Final    Comment: (NOTE) SARS-CoV-2 target nucleic acids are NOT DETECTED.  The SARS-CoV-2 RNA is generally detectable in upper respiratory specimens during the acute phase of infection. The lowest concentration of SARS-CoV-2 viral copies this assay can detect is 138 copies/mL. A negative result does not preclude SARS-Cov-2 infection and should not be used as the sole basis for treatment or other patient management decisions. A negative result may occur with  improper specimen collection/handling, submission of specimen other than nasopharyngeal swab, presence of viral mutation(s) within the areas targeted by this assay, and inadequate number  of viral copies(<138 copies/mL). A negative result must be combined  with clinical observations, patient history, and epidemiological information. The expected result is Negative.  Fact Sheet for Patients:  EntrepreneurPulse.com.au  Fact Sheet for Healthcare Providers:  IncredibleEmployment.be  This test is no t yet approved or cleared by the [place] FDA and  has been authorized for detection and/or diagnosis of SARS-CoV-2 by FDA under an Emergency Use Authorization (EUA). This EUA will remain  in effect (meaning this test can be used) for the duration of the COVID-19 declaration under Section 564(b)(1) of the Act, 21 U.S.C.section 360bbb-3(b)(1), unless the authorization is terminated  or revoked sooner.       Influenza A by PCR NEGATIVE NEGATIVE Final   Influenza B by PCR NEGATIVE NEGATIVE Final    Comment: (NOTE) The Xpert Xpress SARS-CoV-2/FLU/RSV plus assay is intended as an aid in the diagnosis of influenza from Nasopharyngeal swab specimens and should not be used as a sole basis for treatment. Nasal washings and aspirates are unacceptable for Xpert Xpress SARS-CoV-2/FLU/RSV testing.  Fact Sheet for Patients: EntrepreneurPulse.com.au  Fact Sheet for Healthcare Providers: IncredibleEmployment.be  This test is not yet approved or cleared by the [place] FDA and has been authorized for detection and/or diagnosis of SARS-CoV-2 by FDA under an Emergency Use Authorization (EUA). This EUA will remain in effect (meaning this test can be used) for the duration of the COVID-19 declaration under Section 564(b)(1) of the Act, 21 U.S.C. section 360bbb-3(b)(1), unless the authorization is terminated or revoked.  Performed at [facility], [address] [place], [place] [address]     Labs: CBC: Recent Labs  Lab 12/15/21 0204 12/17/21 0626 12/18/21 0422  WBC 16.9* 16.8* 13.1*  NEUTROABS  --  14.8* 11.3*  HGB 13.9 12.0* 11.4*  HCT 43.0 36.2*  34.6*  MCV 97.5 95.5 94.8  PLT 494* 310 702   Basic Metabolic Panel: Recent Labs  Lab 12/15/21 0204 12/16/21 0408 12/17/21 0626 12/18/21 0422  NA 139 139 137 138  K 3.6 3.9 3.9 3.3*  CL 104 107 110 112*  CO2 '27 22 22 '$ 20*  GLUCOSE 156* 163* 116* 108*  BUN 21 32* 28* 24*  CREATININE 1.39* 2.04* 1.68* 1.30*  CALCIUM 9.0 8.2* 8.2* 8.0*   Liver Function Tests: Recent Labs  Lab 12/15/21 0204  AST 23  ALT 23  ALKPHOS 60  BILITOT 0.3  PROT 8.3*  ALBUMIN 3.7   CBG: No results for input(s): "GLUCAP" in the last 168 hours.  Discharge time spent: greater than 30 minutes.  Signed: Kerryn Tennant, DO [facility] 12/18/2021

## 2021-12-17 NOTE — Progress Notes (Signed)
Mobility Specialist - Progress Note    12/17/21 1200  Therapy Vitals  Temp 99.2 F (37.3 C)  Pulse Rate 85  Resp 18  BP (!) 160/105  Oxygen Therapy  SpO2 98 %  O2 Device Room Air  Mobility  Activity Refused mobility   Pt refuses mobility today on two attempts. Pt had just gotten up prior to Mobility Tech's arrival. Will attempt again at another date/time.   Gretchen Short  Mobility Specialist  12/17/21 12:44 PM

## 2021-12-18 DIAGNOSIS — J209 Acute bronchitis, unspecified: Secondary | ICD-10-CM | POA: Diagnosis present

## 2021-12-18 LAB — RESPIRATORY PANEL BY PCR

## 2021-12-18 LAB — CBC WITH DIFFERENTIAL/PLATELET
Abs Immature Granulocytes: 0.09 10*3/uL — ABNORMAL HIGH (ref 0.00–0.07)
Basophils Absolute: 0.1 10*3/uL (ref 0.0–0.1)
Basophils Relative: 0 %
Eosinophils Absolute: 0.1 10*3/uL (ref 0.0–0.5)
Eosinophils Relative: 1 %
HCT: 34.6 % — ABNORMAL LOW (ref 39.0–52.0)
Hemoglobin: 11.4 g/dL — ABNORMAL LOW (ref 13.0–17.0)
Immature Granulocytes: 1 %
Lymphocytes Relative: 5 %
Lymphs Abs: 0.6 10*3/uL — ABNORMAL LOW (ref 0.7–4.0)
MCH: 31.2 pg (ref 26.0–34.0)
MCHC: 32.9 g/dL (ref 30.0–36.0)
MCV: 94.8 fL (ref 80.0–100.0)
Monocytes Absolute: 1 10*3/uL (ref 0.1–1.0)
Monocytes Relative: 7 %
Neutro Abs: 11.3 10*3/uL — ABNORMAL HIGH (ref 1.7–7.7)
Neutrophils Relative %: 86 %
Platelets: 244 10*3/uL (ref 150–400)
RBC: 3.65 MIL/uL — ABNORMAL LOW (ref 4.22–5.81)
RDW: 13.3 % (ref 11.5–15.5)
WBC: 13.1 10*3/uL — ABNORMAL HIGH (ref 4.0–10.5)
nRBC: 0 % (ref 0.0–0.2)

## 2021-12-18 LAB — BASIC METABOLIC PANEL
Anion gap: 6 (ref 5–15)
BUN: 24 mg/dL — ABNORMAL HIGH (ref 8–23)
CO2: 20 mmol/L — ABNORMAL LOW (ref 22–32)
Calcium: 8 mg/dL — ABNORMAL LOW (ref 8.9–10.3)
Chloride: 112 mmol/L — ABNORMAL HIGH (ref 98–111)
Creatinine, Ser: 1.3 mg/dL — ABNORMAL HIGH (ref 0.61–1.24)
GFR, Estimated: >60 mL/min (ref >60)
Glucose, Bld: 108 mg/dL — ABNORMAL HIGH (ref 70–99)
Potassium: 3.3 mmol/L — ABNORMAL LOW (ref 3.5–5.1)
Sodium: 138 mmol/L (ref 135–145)

## 2021-12-18 LAB — RESP PANEL BY RT-PCR (FLU A&B, COVID) ARPGX2
Influenza A by PCR: NEGATIVE
Influenza B by PCR: NEGATIVE
SARS Coronavirus 2 by RT PCR: NEGATIVE

## 2021-12-18 MED ORDER — ALBUTEROL SULFATE HFA 108 (90 BASE) MCG/ACT IN AERS: 2.0000 | INHALATION_SPRAY | Freq: Four times a day (QID) | RESPIRATORY_TRACT | 2 refills | Status: AC | PRN

## 2021-12-18 MED ORDER — FUROSEMIDE 20 MG PO TABS
20.0000 mg | ORAL_TABLET | Freq: Once | ORAL | Status: AC
  Administered 2021-12-18: 20 mg via ORAL
  Filled 2021-12-18: qty 1

## 2021-12-18 NOTE — Progress Notes (Signed)
AVS reviewed with pt who verbalized understanding of all education/instructions. LDA removal complete. Pt dressed and transported to vehicle for d/c to home.

## 2021-12-18 NOTE — Progress Notes (Signed)
PROGRESS NOTE  [name] FKC:[id number] DOB: June 21, 1960 DOA: 12/15/2021 PCP: Pcp, No  Brief History   Willie White is a 61 y.o. male with medical history significant for  hypertension, COPD not on home oxygen, solitary right kidney who presented to [facility] ED on 12/15/2021 with complaints of severe suprapubic abdominal pain worse on the right. He also states that he has been unable to urinate. He has had nausea and vomiting, but no fever or chills.    On CT the patient was found to have a 9 mm obstructing renal calculus at eht right UPH with numerous 1-2 mm nonobstructing right renal calculi. There was also perinephric inflammatory fat stranding. MRI is recommended to rule out presence of an underlying neoplastic process. The left kidney is absent. There are bilateral low-attenuation adrenal masses consistent with adrenal adenomas. There are large bilateral hydroceles. There is also ASCVD.   Urology was consulted and the patient underwent cystoscopy with right ureteral stent this morning.  The patient has a single kidney. Baseline creatinine appears to be 1.2. Creatinine upon presentation was 1.26. Creatinine this morning following cystoscopy and ureteral stent placement is 2.04. Nephrology has been consulted.   Urine culture and 1/2 blood cultures has grown out E. Coli. The patient is receiving Rocephin. Awaiting susceptibilities.  Consultants  Urology Nephrology  Procedures  Cystoscopy with ureteral stent placement  Antibiotics   Anti-infectives (From admission, onward)    Start     Dose/Rate Route Frequency Ordered Stop   12/18/21 0800  levofloxacin (LEVAQUIN) IVPB 750 mg  Status:  Discontinued        750 mg 100 mL/hr over 90 Minutes Intravenous Every 24 hours 12/17/21 1554 12/17/21 1555   12/18/21 0800  levofloxacin (LEVAQUIN) IVPB 750 mg        750 mg 100 mL/hr over 90 Minutes Intravenous Every 24 hours 12/17/21 1557     12/18/21 0600  ceFAZolin (ANCEF) IVPB 2g/100 mL premix   Status:  Discontinued        2 g 200 mL/hr over 30 Minutes Intravenous Every 8 hours 12/17/21 1035 12/17/21 1554   12/17/21 0000  ciprofloxacin (CIPRO) 500 MG tablet        500 mg Oral 2 times daily 12/17/21 1645 12/28/21 2359   12/16/21 0900  cefTRIAXone (ROCEPHIN) 1 g in sodium chloride 0.9 % 100 mL IVPB  Status:  Discontinued        1 g 200 mL/hr over 30 Minutes Intravenous Every 24 hours 12/15/21 0854 12/15/21 1553   12/16/21 0700  cefTRIAXone (ROCEPHIN) 2 g in sodium chloride 0.9 % 100 mL IVPB  Status:  Discontinued        2 g 200 mL/hr over 30 Minutes Intravenous Every 24 hours 12/15/21 1553 12/17/21 1035   12/15/21 0615  cefTRIAXone (ROCEPHIN) 2 g in sodium chloride 0.9 % 100 mL IVPB        2 g 200 mL/hr over 30 Minutes Intravenous  Once 12/15/21 0609 12/15/21 0722      Subjective  The patient is resting comfortably. No new complaints.  Objective   Vitals:  Vitals:   12/18/21 0734 12/18/21 0819  BP:  (!) 149/88  Pulse: 70 73  Resp: 18 20  Temp:  98.4 F (36.9 C)  SpO2: 95% 94%    Exam:  Constitutional:  The patient is awake, alert, and oriented x 3. No acute distress. Respiratory:  No increased work of breathing. No wheezes, rales, or rhonchi No tactile fremitus Cardiovascular:  Regular rate and rhythm No murmurs, ectopy, or gallups. No lateral PMI. No thrills. Abdomen:  Abdomen is soft, non-tender, non-distended No hernias, masses, or organomegaly Normoactive bowel sounds.  Musculoskeletal:  No cyanosis, clubbing, or edema Skin:  No rashes, lesions, ulcers palpation of skin: no induration or nodules Neurologic:  CN 2-12 intact Sensation all 4 extremities intact Psychiatric:  Mental status Mood, affect appropriate Orientation to person, place, time  judgment and insight appear intact  I have personally reviewed the following:   Today's Data  Vitals  Lab Data  CBC BMP  Micro Data  1/2 Blood cultures + E.coli Urine culture + E.  coli  Imaging  CT renal stone study.  Cardiology Data  EKG  Other Data    Scheduled Meds:  Chlorhexidine Gluconate Cloth  6 each Topical Q0600   ipratropium-albuterol  3 mL Nebulization Q6H   metoprolol tartrate  25 mg Oral BID   nicotine  21 mg Transdermal Daily   oxybutynin  10 mg Oral Daily   Continuous Infusions:  levofloxacin (LEVAQUIN) IV 750 mg (12/18/21 4166)    Principal Problem:   Acute unilateral obstructive uropathy Active Problems:   Pyelonephritis   COPD (chronic obstructive pulmonary disease) ([place])   Hypertension   AKI (acute kidney injury) ([place])   E coli bacteremia   Acute bronchitis   LOS: 3 days   A & P  Assessment and Plan: * Acute unilateral obstructive uropathy S/P cystoscopy and right ureteral stent placement. I appreciate urology's assistance. The patient is to follow up with urology as outpatient in 2-3 weeks as directed.  Acute bronchitis While patient was preparing to discharge on 12/17/2021, he was noted to be hypoxic as he was moving about the room. The patient's oxygen saturations recovered to the mid 90's on 2L O2. He was given round the clock breathing treatments. He will be tested for influenza and COVID-19.  A general viral respiratory panel was also ordered.  I do not feel that this represents a bacterial infection as the patient has had no fevers or elevation of WBC. CXR is indicative of bronchitis.   E coli bacteremia 1/2 blood cultures obtained on 12/15/2021 has grown out E. Coli. The organism is  The patient is receiving Rocephin IV. Surveillance cultures were obtained and have had no growth. Infectious disease was consulted.  He will be discharged to home on oral ciprofloxacin.   AKI (acute kidney injury) ([place]) Baseline creatinine appears to be 1.2. Creatinine upon admission with obstructive uropathy was 1.36. Following cystoscopy and placement of ureteral stent creatinine is increased to 2.04. Renal ultrasound demonstrated correct  placement of stent. Creatinine is down to 1.68 today.  Hypertension The patient is normotensive without antihypertensive therapy. It does not appear that he is on anything at home.   COPD (chronic obstructive pulmonary disease) ([place]) Noted. At baseline. The patient has rhonchi throughout his lung fields. Will make albuterol available as needed.  Pyelonephritis The patient is receiving rocephin 2 gm IV daily. 1/2 blood cultures has grown out E.coli. Urine culture has grown out E. Coli. E. Coli is pan-sensitive. Will repeat blood cultures tomorrow.The patient has been converted to cipro for discharge to home.   I have seen and examined this patient myself. I have spent 38 minutes in his evaluation and care.  DVT prophylaxis: SCD's Code Status: Full Code Family Communication: None available Disposition Plan: Home    Ladawn Boullion, DO [facility] Direct contact: see [email or web address]  7PM-7AM contact night coverage as  above 12/17/2021, 4:43 PM  LOS: 1 day

## 2021-12-18 NOTE — Assessment & Plan Note (Addendum)
While patient was preparing to discharge on 12/17/2021, he was noted to be hypoxic as he was moving about the room. The patient's oxygen saturations recovered to the mid 90's on 2L O2. He was given round the clock breathing treatments. He will be tested for influenza and COVID-19.  A general viral respiratory panel was also ordered.  I do not feel that this represents a bacterial infection as the patient has had no fevers or elevation of WBC. CXR is indicative of bronchitis.

## 2021-12-22 LAB — CULTURE, BLOOD (ROUTINE X 2)
Culture: NO GROWTH
Culture: NO GROWTH
Special Requests: ADEQUATE
Special Requests: ADEQUATE

## 2021-12-28 ENCOUNTER — Telehealth: Payer: Self-pay

## 2021-12-28 ENCOUNTER — Other Ambulatory Visit
Admission: RE | Admit: 2021-12-28 | Discharge: 2021-12-28 | Disposition: A | Discharge status: Home / Self Care | Payer: Self-pay | Source: Ambulatory Visit | Attending: Infectious Diseases | Admitting: Infectious Diseases

## 2021-12-28 ENCOUNTER — Ambulatory Visit: Payer: Self-pay | Attending: Infectious Diseases | Admitting: Infectious Diseases

## 2021-12-28 ENCOUNTER — Encounter: Payer: Self-pay | Admitting: Infectious Diseases

## 2021-12-28 ENCOUNTER — Other Ambulatory Visit: Payer: Self-pay | Admitting: Physician Assistant

## 2021-12-28 VITALS — BP 126/82 | HR 77 | Temp 98.1°F | Ht 68.0 in | Wt 186.0 lb

## 2021-12-28 DIAGNOSIS — B9620 Unspecified Escherichia coli [E. coli] as the cause of diseases classified elsewhere: Secondary | ICD-10-CM | POA: Insufficient documentation

## 2021-12-28 DIAGNOSIS — N390 Urinary tract infection, site not specified: Secondary | ICD-10-CM | POA: Insufficient documentation

## 2021-12-28 DIAGNOSIS — N138 Other obstructive and reflux uropathy: Secondary | ICD-10-CM

## 2021-12-28 DIAGNOSIS — N201 Calculus of ureter: Secondary | ICD-10-CM

## 2021-12-28 DIAGNOSIS — A4151 Sepsis due to Escherichia coli [E. coli]: Secondary | ICD-10-CM

## 2021-12-28 DIAGNOSIS — N136 Pyonephrosis: Secondary | ICD-10-CM | POA: Insufficient documentation

## 2021-12-28 DIAGNOSIS — R7881 Bacteremia: Secondary | ICD-10-CM | POA: Insufficient documentation

## 2021-12-28 LAB — CBC WITH DIFFERENTIAL/PLATELET
Abs Immature Granulocytes: 0.03 10*3/uL (ref 0.00–0.07)
Basophils Absolute: 0.1 10*3/uL (ref 0.0–0.1)
Basophils Relative: 1 %
Eosinophils Absolute: 0.2 10*3/uL (ref 0.0–0.5)
Eosinophils Relative: 3 %
HCT: 40.6 % (ref 39.0–52.0)
Hemoglobin: 13.1 g/dL (ref 13.0–17.0)
Immature Granulocytes: 0 %
Lymphocytes Relative: 24 %
Lymphs Abs: 1.8 10*3/uL (ref 0.7–4.0)
MCH: 31 pg (ref 26.0–34.0)
MCHC: 32.3 g/dL (ref 30.0–36.0)
MCV: 96 fL (ref 80.0–100.0)
Monocytes Absolute: 1 10*3/uL (ref 0.1–1.0)
Monocytes Relative: 13 %
Neutro Abs: 4.3 10*3/uL (ref 1.7–7.7)
Neutrophils Relative %: 59 %
Platelets: 529 10*3/uL — ABNORMAL HIGH (ref 150–400)
RBC: 4.23 MIL/uL (ref 4.22–5.81)
RDW: 13.3 % (ref 11.5–15.5)
WBC: 7.4 10*3/uL (ref 4.0–10.5)
nRBC: 0 % (ref 0.0–0.2)

## 2021-12-28 LAB — COMPREHENSIVE METABOLIC PANEL
ALT: 26 U/L (ref 0–44)
AST: 22 U/L (ref 15–41)
Albumin: 3.6 g/dL (ref 3.5–5.0)
Alkaline Phosphatase: 67 U/L (ref 38–126)
Anion gap: 8 (ref 5–15)
BUN: 17 mg/dL (ref 8–23)
CO2: 24 mmol/L (ref 22–32)
Calcium: 9 mg/dL (ref 8.9–10.3)
Chloride: 108 mmol/L (ref 98–111)
Creatinine, Ser: 1.12 mg/dL (ref 0.61–1.24)
GFR, Estimated: >60 mL/min (ref >60)
Glucose, Bld: 103 mg/dL — ABNORMAL HIGH (ref 70–99)
Potassium: 3.8 mmol/L (ref 3.5–5.1)
Sodium: 140 mmol/L (ref 135–145)
Total Bilirubin: 0.4 mg/dL (ref 0.3–1.2)
Total Protein: 8.6 g/dL — ABNORMAL HIGH (ref 6.5–8.1)

## 2021-12-28 NOTE — Telephone Encounter (Signed)
-----   Message from Tsosie Billing, MD sent at 12/28/2021  1:43 PM EDT ----- Please let him know that labs drawn today normal  ----- Message ----- From: Interface, Lab In [place] Sent: 12/28/2021  12:24 PM EDT To: Tsosie Billing, MD

## 2021-12-28 NOTE — Telephone Encounter (Signed)
I attempted to reach the patient to relay lab results. Patient did not answer and voicemail was left Willie White

## 2021-12-28 NOTE — Patient Instructions (Addendum)
You are here for follow up of complicated urinary infection with e.coli bacteremia- you have stone rt kidney and today you have ana ppt for lthotripsy Continue cipro - will do labs tody

## 2021-12-28 NOTE — Progress Notes (Unsigned)
Surgical Physician Order [facility] [place]  Dr. Bernardo Heater * Scheduling expectation : Next Available  *Length of Case:   *Clearance needed: no  *Anticoagulation Instructions: N/A  *Aspirin Instructions: N/A  *Post-op visit Date/Instructions:   1 month with MR abdomen with and without contrast prior  *Diagnosis: Right UPJ Stone  *Procedure: right  Ureteroscopy w/laser lithotripsy & stent exchange (18403)   Additional orders: N/A  -Admit type: OUTpatient  -Anesthesia: General  -VTE Prophylaxis Standing Order SCD's       Other:   -Standing Lab Orders Per Anesthesia    Lab other: UA&Urine Culture  -Standing Test orders EKG/Chest x-ray per Anesthesia       Test other:   - Medications:  Ancef 2gm IV  -Other orders:  N/A

## 2021-12-28 NOTE — Telephone Encounter (Signed)
Left message for patient to return my call to set up surgery. Will try again.  

## 2021-12-28 NOTE — Progress Notes (Signed)
NAME: Willie White  DOB: 09-04-1960  MRN: [id number]  Date/Time: 12/28/2021 11:52 AM   Subjective:   ? Willie White is a 61 y.o. male with a history of COPD, solitary rt kidney was recently hospitlaized for complictaed Uti due to E.coli from obstructive uropathy rt kidney with pyonephrosis, ecoli bacteremia, underwent cystoscopy and stent placement and sent home on PO ciprofloxacin for 10 days on 12/17/21 He is doing well. No flank pain, no hematuria, no fever- taking cipro- no side effects HE does not have a follow u appt with urology. He thought this appt was urology.   Past Medical History:  Diagnosis Date   COPD (chronic obstructive pulmonary disease) ([place])    Hypertension     Past Surgical History:  Procedure Laterality Date   CYSTOSCOPY WITH URETEROSCOPY AND STENT PLACEMENT Right 12/15/2021   Procedure: CYSTOSCOPY WITH RIGHT URETERAL STENT PLACEMENT;  Surgeon: Abbie Sons, MD;  Location: [facility] ORS;  Service: Urology;  Laterality: Right;   HAND SURGERY Left    INNER EAR SURGERY Left     Social History   Socioeconomic History   Marital status: Widowed    Spouse name: Not on file   Number of children: Not on file   Years of education: Not on file   Highest education level: Not on file  Occupational History   Not on file  Tobacco Use   Smoking status: Every Day    Packs/day: 1.00    Types: Cigarettes   Smokeless tobacco: Never  Vaping Use   Vaping Use: Never used  Substance and Sexual Activity   Alcohol use: Yes    Comment: Occassional    Drug use: No   Sexual activity: Not on file  Other Topics Concern   Not on file  Social History Narrative   Not on file   Social Determinants of Health   Financial Resource Strain: Not on file  Food Insecurity: Not on file  Transportation Needs: Not on file  Physical Activity: Not on file  Stress: Not on file  Social Connections: Not on file  Intimate Partner Violence: Not on file    No family history on  file. Allergies  Allergen Reactions   Codeine Other (See Comments)    Tremors   Sulfamethoxazole-Trimethoprim Rash   I? Current Outpatient Medications  Medication Sig Dispense Refill   albuterol (VENTOLIN HFA) 108 (90 Base) MCG/ACT inhaler Inhale 2 puffs into the lungs every 6 (six) hours as needed for wheezing or shortness of breath. 8 g 2   ciprofloxacin (CIPRO) 500 MG tablet Take 1 tablet (500 mg total) by mouth 2 (two) times daily for 11 days. 22 tablet 0   metoprolol tartrate (LOPRESSOR) 25 MG tablet Take 1 tablet (25 mg total) by mouth 2 (two) times daily. 60 tablet 0   nicotine (NICODERM CQ - DOSED IN MG/24 HOURS) 21 mg/24hr patch Place 1 patch (21 mg total) onto the skin daily. 28 patch 0   oxybutynin (DITROPAN-XL) 10 MG 24 hr tablet Take 1 tablet (10 mg total) by mouth daily. 30 tablet 0   oxyCODONE (OXY IR/ROXICODONE) 5 MG immediate release tablet Take 1 tablet (5 mg total) by mouth every 4 (four) hours as needed for moderate pain. 20 tablet 0   polyethylene glycol (MIRALAX) 17 g packet Take 17 g by mouth daily. 14 each 0   No current facility-administered medications for this visit.     Abtx:  Anti-infectives (From admission, onward)    None  REVIEW OF SYSTEMS:  Const: negative fever, negative chills, negative weight loss Eyes: negative diplopia or visual changes, negative eye pain ENT: negative coryza, negative sore throat Resp: negative cough, hemoptysis, dyspnea Cards: negative for chest pain, palpitations, lower extremity edema GU: negative for frequency, dysuria and hematuria GI: Negative for abdominal pain, diarrhea, bleeding, constipation Skin: negative for rash and pruritus Heme: negative for easy bruising and gum/nose bleeding MS: negative for myalgias, arthralgias, back pain and muscle weakness Neurolo:negative for headaches, dizziness, vertigo, memory problems  Psych: negative for feelings of anxiety, depression  Endocrine: negative for thyroid,  diabetes Allergy/Immunology- as above  Objective:  VITALS:  BP 126/82   Pulse 77   Temp 98.1 F (36.7 C) (Temporal)   Ht '5\' 8"'$  (1.727 m)   Wt 186 lb (84.4 kg)   BMI 28.28 kg/m   PHYSICAL EXAM:  General: Alert, cooperative, no distress, appears stated age.  Head: Normocephalic, without obvious abnormality, atraumatic. Eyes: Conjunctivae clear, anicteric sclerae. Pupils are equal ENT Nares normal. No drainage or sinus tenderness. Lips, mucosa, and tongue normal. No Thrush Neck: Supple, symmetrical, no adenopathy, thyroid: non tender no carotid bruit and no JVD. Back: No CVA tenderness. Lungs: Clear to auscultation bilaterally. No Wheezing or Rhonchi. No rales. Heart: Regular rate and rhythm, no murmur, rub or gallop. Abdomen: Soft, non-tender,not distended. Bowel sounds normal. No masses Extremities: atraumatic, no cyanosis. No edema. No clubbing Skin: No rashes or lesions. Or bruising Lymph: Cervical, supraclavicular normal. Neurologic: Grossly non-focal Pertinent Labs Lab Results  ? Impression/Recommendation ?E.coli bacteremia with sepsis secondary to complicated UTI.  Obstructive uropathy with rt pyonephrosis, due to PUJ stone- s/p stent On ciprofloxacin for a total of 14 days. He complete on 9/1 Will get labs today Communicated with urologist for follow up appt- they will call him with appt  ? ? __labs WBC N, Cr N PLT 529 _________________________________________________ Discussed with patient in detail Follow PRN Note:  This document was prepared using Dragon voice recognition software and may include unintentional dictation errors.

## 2021-12-29 NOTE — Telephone Encounter (Signed)
Called patient to relay results, no answer. Left HIPAA compliant voicemail requesting callback.   Randle Shatzer D Jcion Buddenhagen, RN  

## 2021-12-29 NOTE — Progress Notes (Signed)
[facility] Posting Form   Surgery Date/Time: Date: 01/18/2022  Surgeon: Dr. 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

## 2021-12-29 NOTE — Telephone Encounter (Signed)
I spoke with Willie White. We have discussed possible surgery dates and Tuesday September 19th, 2023 was agreed upon by all parties. Patient given information about surgery date, what to expect pre-operatively and post operatively.  We discussed that a Pre-Admission Testing office will be calling to set up the pre-op visit that will take place prior to surgery, and that these appointments are typically done over the phone with a Pre-Admissions RN.  Informed patient that our office will communicate any additional care to be provided after surgery. Patients questions or concerns were discussed during our call. Advised to call our office should there be any additional information, questions or concerns that arise. Patient verbalized understanding.

## 2021-12-29 NOTE — Telephone Encounter (Signed)
Patient returned called. I relayed normal lab results.  Gibson Quierra Silverio, CMA

## 2022-01-07 ENCOUNTER — Other Ambulatory Visit: Payer: Self-pay

## 2022-01-10 ENCOUNTER — Inpatient Hospital Stay
Admission: EM | Admit: 2022-01-10 | Discharge: 2022-01-13 | DRG: 872 | Disposition: A | Discharge status: Home / Self Care | Payer: Self-pay | Attending: Internal Medicine | Admitting: Internal Medicine

## 2022-01-10 ENCOUNTER — Other Ambulatory Visit: Payer: Self-pay

## 2022-01-10 ENCOUNTER — Emergency Department: Payer: Self-pay

## 2022-01-10 DIAGNOSIS — F17210 Nicotine dependence, cigarettes, uncomplicated: Secondary | ICD-10-CM | POA: Diagnosis present

## 2022-01-10 DIAGNOSIS — J449 Chronic obstructive pulmonary disease, unspecified: Secondary | ICD-10-CM | POA: Diagnosis present

## 2022-01-10 DIAGNOSIS — R6520 Severe sepsis without septic shock: Secondary | ICD-10-CM | POA: Diagnosis present

## 2022-01-10 DIAGNOSIS — A4151 Sepsis due to Escherichia coli [E. coli]: Principal | ICD-10-CM | POA: Diagnosis present

## 2022-01-10 DIAGNOSIS — Z882 Allergy status to sulfonamides status: Secondary | ICD-10-CM

## 2022-01-10 DIAGNOSIS — Z79899 Other long term (current) drug therapy: Secondary | ICD-10-CM

## 2022-01-10 DIAGNOSIS — Z960 Presence of urogenital implants: Secondary | ICD-10-CM

## 2022-01-10 DIAGNOSIS — N390 Urinary tract infection, site not specified: Secondary | ICD-10-CM | POA: Diagnosis present

## 2022-01-10 DIAGNOSIS — Z885 Allergy status to narcotic agent status: Secondary | ICD-10-CM

## 2022-01-10 DIAGNOSIS — N136 Pyonephrosis: Secondary | ICD-10-CM | POA: Diagnosis present

## 2022-01-10 DIAGNOSIS — E669 Obesity, unspecified: Secondary | ICD-10-CM | POA: Diagnosis present

## 2022-01-10 DIAGNOSIS — Z6830 Body mass index (BMI) 30.0-30.9, adult: Secondary | ICD-10-CM

## 2022-01-10 DIAGNOSIS — I10 Essential (primary) hypertension: Secondary | ICD-10-CM

## 2022-01-10 DIAGNOSIS — A4150 Gram-negative sepsis, unspecified: Secondary | ICD-10-CM | POA: Diagnosis present

## 2022-01-10 DIAGNOSIS — E871 Hypo-osmolality and hyponatremia: Secondary | ICD-10-CM | POA: Diagnosis present

## 2022-01-10 DIAGNOSIS — N12 Tubulo-interstitial nephritis, not specified as acute or chronic: Principal | ICD-10-CM

## 2022-01-10 DIAGNOSIS — A419 Sepsis, unspecified organism: Secondary | ICD-10-CM

## 2022-01-10 DIAGNOSIS — N179 Acute kidney failure, unspecified: Secondary | ICD-10-CM | POA: Diagnosis present

## 2022-01-10 LAB — URINALYSIS, ROUTINE W REFLEX MICROSCOPIC
Bilirubin Urine: NEGATIVE
Glucose, UA: NEGATIVE mg/dL
Ketones, ur: NEGATIVE mg/dL
Nitrite: POSITIVE — AB
Protein, ur: 100 mg/dL — AB
Specific Gravity, Urine: 1.014 (ref 1.005–1.030)
Squamous Epithelial / HPF: NONE SEEN (ref 0–5)
WBC, UA: >50 WBC/hpf — ABNORMAL HIGH (ref 0–5)
pH: 5 (ref 5.0–8.0)

## 2022-01-10 LAB — BASIC METABOLIC PANEL
Anion gap: 13 (ref 5–15)
BUN: 28 mg/dL — ABNORMAL HIGH (ref 8–23)
CO2: 18 mmol/L — ABNORMAL LOW (ref 22–32)
Calcium: 9.4 mg/dL (ref 8.9–10.3)
Chloride: 101 mmol/L (ref 98–111)
Creatinine, Ser: 1.6 mg/dL — ABNORMAL HIGH (ref 0.61–1.24)
GFR, Estimated: 48 mL/min — ABNORMAL LOW (ref >60)
Glucose, Bld: 129 mg/dL — ABNORMAL HIGH (ref 70–99)
Potassium: 4.4 mmol/L (ref 3.5–5.1)
Sodium: 132 mmol/L — ABNORMAL LOW (ref 135–145)

## 2022-01-10 LAB — CBC
HCT: 43 % (ref 39.0–52.0)
Hemoglobin: 14 g/dL (ref 13.0–17.0)
MCH: 30 pg (ref 26.0–34.0)
MCHC: 32.6 g/dL (ref 30.0–36.0)
MCV: 92.1 fL (ref 80.0–100.0)
Platelets: 282 10*3/uL (ref 150–400)
RBC: 4.67 MIL/uL (ref 4.22–5.81)
RDW: 13.7 % (ref 11.5–15.5)
WBC: 24.3 10*3/uL — ABNORMAL HIGH (ref 4.0–10.5)
nRBC: 0 % (ref 0.0–0.2)

## 2022-01-10 LAB — LACTIC ACID, PLASMA: Lactic Acid, Venous: 1.5 mmol/L (ref 0.5–1.9)

## 2022-01-10 MED ORDER — ACETAMINOPHEN 500 MG PO TABS
1000.0000 mg | ORAL_TABLET | Freq: Once | ORAL | Status: AC
  Administered 2022-01-10: 1000 mg via ORAL
  Filled 2022-01-10: qty 2

## 2022-01-10 MED ORDER — ALBUTEROL SULFATE (2.5 MG/3ML) 0.083% IN NEBU: 2.5000 mg | INHALATION_SOLUTION | Freq: Four times a day (QID) | RESPIRATORY_TRACT | Status: DC | PRN

## 2022-01-10 MED ORDER — ENOXAPARIN SODIUM 60 MG/0.6ML IJ SOSY
0.5000 mg/kg | PREFILLED_SYRINGE | INTRAMUSCULAR | Status: DC
  Filled 2022-01-10 (×2): qty 0.6

## 2022-01-10 MED ORDER — NICOTINE 21 MG/24HR TD PT24
21.0000 mg | MEDICATED_PATCH | Freq: Every day | TRANSDERMAL | Status: DC
  Filled 2022-01-10 (×2): qty 1

## 2022-01-10 MED ORDER — LACTATED RINGERS IV BOLUS
1000.0000 mL | Freq: Once | INTRAVENOUS | Status: AC
  Administered 2022-01-10: 1000 mL via INTRAVENOUS

## 2022-01-10 MED ORDER — ONDANSETRON HCL 4 MG/2ML IJ SOLN: 4.0000 mg | Freq: Four times a day (QID) | INTRAMUSCULAR | Status: DC | PRN

## 2022-01-10 MED ORDER — ACETAMINOPHEN 325 MG PO TABS
650.0000 mg | ORAL_TABLET | Freq: Four times a day (QID) | ORAL | Status: DC | PRN
  Administered 2022-01-11 – 2022-01-13 (×2): 650 mg via ORAL
  Filled 2022-01-10 (×2): qty 2

## 2022-01-10 MED ORDER — ONDANSETRON HCL 4 MG PO TABS: 4.0000 mg | ORAL_TABLET | Freq: Four times a day (QID) | ORAL | Status: DC | PRN

## 2022-01-10 MED ORDER — METOPROLOL TARTRATE 25 MG PO TABS
25.0000 mg | ORAL_TABLET | Freq: Two times a day (BID) | ORAL | Status: DC
  Administered 2022-01-11: 25 mg via ORAL
  Filled 2022-01-10 (×6): qty 1

## 2022-01-10 MED ORDER — SODIUM CHLORIDE 0.9 % IV SOLN: INTRAVENOUS | Status: DC

## 2022-01-10 MED ORDER — OXYBUTYNIN CHLORIDE ER 5 MG PO TB24
10.0000 mg | ORAL_TABLET | Freq: Every day | ORAL | Status: DC
  Administered 2022-01-11 – 2022-01-13 (×3): 10 mg via ORAL
  Filled 2022-01-10 (×3): qty 2

## 2022-01-10 MED ORDER — SODIUM CHLORIDE 0.9 % IV SOLN
2.0000 g | INTRAVENOUS | Status: DC
  Administered 2022-01-11 – 2022-01-12 (×3): 2 g via INTRAVENOUS
  Filled 2022-01-10: qty 2
  Filled 2022-01-10: qty 20
  Filled 2022-01-10: qty 2

## 2022-01-10 MED ORDER — OXYCODONE HCL 5 MG PO TABS
5.0000 mg | ORAL_TABLET | ORAL | Status: DC | PRN
  Administered 2022-01-11 (×2): 5 mg via ORAL
  Filled 2022-01-10 (×2): qty 1

## 2022-01-10 MED ORDER — POLYETHYLENE GLYCOL 3350 17 G PO PACK
17.0000 g | PACK | Freq: Every day | ORAL | Status: DC
  Filled 2022-01-10: qty 1

## 2022-01-10 MED ORDER — ACETAMINOPHEN 650 MG RE SUPP: 650.0000 mg | Freq: Four times a day (QID) | RECTAL | Status: DC | PRN

## 2022-01-10 MED ORDER — MAGNESIUM HYDROXIDE 400 MG/5ML PO SUSP: 30.0000 mL | Freq: Every day | ORAL | Status: DC | PRN

## 2022-01-10 NOTE — ED Provider Notes (Signed)
[facility] Provider Note    Event Date/Time   First MD Initiated Contact with Patient 01/10/22 2145     (approximate)   History   Flank Pain   HPI  Willie White is a 61 y.o. male past medical history of signal kidney with history of recent right ureteral stent due to obstructive uropathy presents with right flank pain fevers and chills.  Patient started having pain in the right flank yesterday.  Started having chills and fevers today.  No nausea vomiting or diarrhea.  Denies cough congestion.  He was admitted about a month ago for [place] with obstructing stone underwent right-sided stent.  He had coli growing in urine and blood that was pansensitive.     Past Medical History:  Diagnosis Date   COPD (chronic obstructive pulmonary disease) ([place])    Hypertension     Patient Active Problem List   Diagnosis Date Noted   Acute bronchitis 12/18/2021   AKI (acute kidney injury) ([place]) 12/16/2021   E coli bacteremia 12/16/2021   Acute unilateral obstructive uropathy 12/15/2021   Pyelonephritis 12/15/2021   COPD (chronic obstructive pulmonary disease) ([facility])    Hypertension      Physical Exam  Triage Vital Signs: ED Triage Vitals  Enc Vitals Group     BP 01/10/22 1956 122/84     Pulse Rate 01/10/22 1956 (!) 58     Resp 01/10/22 1956 20     Temp 01/10/22 1956 (!) 101.3 F (38.5 C)     Temp Source 01/10/22 1956 Oral     SpO2 01/10/22 1956 95 %     Weight 01/10/22 1957 198 lb (89.8 kg)     Height 01/10/22 1957 '5\' 8"'$  (1.727 m)     Head Circumference --      Peak Flow --      Pain Score 01/10/22 1956 9     Pain Loc --      Pain Edu? --      Excl. in [place]? --     Most recent vital signs: Vitals:   01/10/22 1956  BP: 122/84  Pulse: (!) 58  Resp: 20  Temp: (!) 101.3 F (38.5 C)  SpO2: 95%     General: Awake, no distress.  Patient is diaphoretic CV:  Good peripheral perfusion.  Resp:  Normal effort.  Abd:  No distention.  Tenderness to  palpation in the right CVA region, mild tenderness in the right lower quadrant Neuro:             Awake, Alert, Oriented x 3  Other:     ED Results / Procedures / Treatments  Labs (all labs ordered are listed, but only abnormal results are displayed) Labs Reviewed  URINALYSIS, ROUTINE W REFLEX MICROSCOPIC - Abnormal; Notable for the following components:      Result Value   Color, Urine YELLOW (*)    APPearance CLOUDY (*)    Hgb urine dipstick SMALL (*)    Protein, ur 100 (*)    Nitrite POSITIVE (*)    Leukocytes,Ua LARGE (*)    WBC, UA >50 (*)    Bacteria, UA MANY (*)    All other components within normal limits  BASIC METABOLIC PANEL - Abnormal; Notable for the following components:   Sodium 132 (*)    CO2 18 (*)    Glucose, Bld 129 (*)    BUN 28 (*)    Creatinine, Ser 1.60 (*)    GFR, Estimated 48 (*)  All other components within normal limits  CBC - Abnormal; Notable for the following components:   WBC 24.3 (*)    All other components within normal limits  URINE CULTURE  CULTURE, BLOOD (ROUTINE X 2)  CULTURE, BLOOD (ROUTINE X 2)  LACTIC ACID, PLASMA     EKG     RADIOLOGY CT renal study reviewed and interpreted by myself shows single right-sided kidney with right ureteral stent appears to be functioning   PROCEDURES:  Critical Care performed: Yes, see critical care procedure note(s)  .Critical Care  Performed by: Rada Hay, MD Authorized by: Rada Hay, MD   Critical care provider statement:    Critical care time (minutes):  30   Critical care was time spent personally by me on the following activities:  Development of treatment plan with patient or surrogate, discussions with consultants, evaluation of patient's response to treatment, examination of patient, ordering and review of laboratory studies, ordering and review of radiographic studies, ordering and performing treatments and interventions, pulse oximetry, re-evaluation of  patient's condition and review of old charts   The patient is on the cardiac monitor to evaluate for evidence of arrhythmia and/or significant heart rate changes.   MEDICATIONS ORDERED IN ED: Medications  lactated ringers bolus 1,000 mL (has no administration in time range)  acetaminophen (TYLENOL) tablet 1,000 mg (1,000 mg Oral Given 01/10/22 2006)     IMPRESSION / MDM / ASSESSMENT AND PLAN / ED COURSE  I reviewed the triage vital signs and the nursing notes.                              Patient's presentation is most consistent with acute presentation with potential threat to life or bodily function.  Differential diagnosis includes, but is not limited to, stent on function causing obstructive uropathy, pyelonephritis, perinephric abscess  Patient is a 61 year old male presents with fever flank pain.  He has a single right-sided kidney and has a stent on that side currently.  He is several to 1 and 1.3 rest of his vitals are reassuring.  Blood pressures okay.  He is mildly diaphoretic but appears nontoxic.  Does have right CVA tenderness.  UA grossly positive.  He has a leukocytosis of almost 24.  CT renal study obtained shows that the right stent is in place hydronephrosis has improved from prior.  There is stranding which is consistent with pyelonephritis.  There is mention of an improving cystic lesion which could be neoplastic versus abscess.  Given the stent is in working do not feel that he requires urgent urology evaluation.  Will cover with ceftriaxone as his E. coli was pansensitive in the past.  Will give fluids.  Patient will require admission.  Lactate blood cultures pending.       FINAL CLINICAL IMPRESSION(S) / ED DIAGNOSES   Final diagnoses:  Pyelonephritis  Sepsis, due to unspecified organism, unspecified whether acute organ dysfunction present [facility])     Rx / DC Orders   ED Discharge Orders     None        Note:  This document was prepared using Dragon  voice recognition software and may include unintentional dictation errors.   Rada Hay, MD 01/10/22 2205

## 2022-01-10 NOTE — ED Triage Notes (Signed)
Pt presents via POV c/o right sided flank pain. Reports only has right kidney. Reports recent stent to right kidney with stones and infection in August.

## 2022-01-11 DIAGNOSIS — Z960 Presence of urogenital implants: Secondary | ICD-10-CM

## 2022-01-11 DIAGNOSIS — N201 Calculus of ureter: Secondary | ICD-10-CM

## 2022-01-11 DIAGNOSIS — I10 Essential (primary) hypertension: Secondary | ICD-10-CM

## 2022-01-11 DIAGNOSIS — N136 Pyonephrosis: Secondary | ICD-10-CM

## 2022-01-11 LAB — CBC
HCT: 35.5 % — ABNORMAL LOW (ref 39.0–52.0)
Hemoglobin: 12 g/dL — ABNORMAL LOW (ref 13.0–17.0)
MCH: 30.8 pg (ref 26.0–34.0)
MCHC: 33.8 g/dL (ref 30.0–36.0)
MCV: 91.3 fL (ref 80.0–100.0)
Platelets: 231 10*3/uL (ref 150–400)
RBC: 3.89 MIL/uL — ABNORMAL LOW (ref 4.22–5.81)
RDW: 13.6 % (ref 11.5–15.5)
WBC: 24.2 10*3/uL — ABNORMAL HIGH (ref 4.0–10.5)
nRBC: 0 % (ref 0.0–0.2)

## 2022-01-11 LAB — CORTISOL-AM, BLOOD: Cortisol - AM: 13.1 ug/dL (ref 6.7–22.6)

## 2022-01-11 LAB — BASIC METABOLIC PANEL
Anion gap: 8 (ref 5–15)
BUN: 28 mg/dL — ABNORMAL HIGH (ref 8–23)
CO2: 19 mmol/L — ABNORMAL LOW (ref 22–32)
Calcium: 8.5 mg/dL — ABNORMAL LOW (ref 8.9–10.3)
Chloride: 105 mmol/L (ref 98–111)
Creatinine, Ser: 1.43 mg/dL — ABNORMAL HIGH (ref 0.61–1.24)
GFR, Estimated: 55 mL/min — ABNORMAL LOW (ref >60)
Glucose, Bld: 124 mg/dL — ABNORMAL HIGH (ref 70–99)
Potassium: 3.8 mmol/L (ref 3.5–5.1)
Sodium: 132 mmol/L — ABNORMAL LOW (ref 135–145)

## 2022-01-11 LAB — PROTIME-INR
INR: 1.3 — ABNORMAL HIGH (ref 0.8–1.2)
Prothrombin Time: 15.8 seconds — ABNORMAL HIGH (ref 11.4–15.2)

## 2022-01-11 LAB — PROCALCITONIN: Procalcitonin: 9.43 ng/mL

## 2022-01-11 MED ORDER — SODIUM CHLORIDE 0.9 % IV BOLUS
250.0000 mL | Freq: Once | INTRAVENOUS | Status: AC
  Administered 2022-01-11: 250 mL via INTRAVENOUS

## 2022-01-11 MED ORDER — HYDROMORPHONE HCL 1 MG/ML IJ SOLN: 0.5000 mg | INTRAMUSCULAR | Status: DC | PRN

## 2022-01-11 MED ORDER — IPRATROPIUM-ALBUTEROL 0.5-2.5 (3) MG/3ML IN SOLN: 3.0000 mL | RESPIRATORY_TRACT | Status: DC | PRN

## 2022-01-11 NOTE — Assessment & Plan Note (Signed)
-   We will continue Lopressor.

## 2022-01-11 NOTE — H&P (Signed)
[place]   PATIENT NAME: Willie White    MR#:  [id number]  DATE OF BIRTH:  04-13-1961  DATE OF ADMISSION:  01/10/2022  PRIMARY CARE PHYSICIAN: Pcp, No   Patient is coming from: Home  REQUESTING/REFERRING PHYSICIAN: Rada Hay, MD  CHIEF COMPLAINT:   Chief Complaint  Patient presents with   Flank Pain    HISTORY OF PRESENT ILLNESS:  Willie White is a 61 y.o. male with medical history significant for COPD and hypertension, who presented to the emergency room with a [place] of right flank pain with associated fever and chills as well as dysuria, urinary frequency and urgency.  He denies any nausea or vomiting or abdominal pain.  No diarrhea or melena or bright red bleeding per rectum.  No chest pain or palpitations.  No cough or wheezing or dyspnea.  About a month ago he had a infected kidney stone and underwent right stent placement.  ED Course: When the patient came to the ER temperature was 101.3 with heart rate of 58 with otherwise normal vital signs.  Labs revealed mild hyponatremia and a CO2 of 18 with BUN of 28 and creatinine 1.6 up from 1.12 on 12/28/2021.  CBC showed leukocytosis 24.3 up from 7.4 then.  UA was positive for UTI.  Urine and blood cultures were sent.    Imaging: CT renal stone revealed the following: 1. Interval placement of a right ureteral stent with decreased right hydronephrosis. 2. Numerous nonobstructing right renal calculi measuring up to 1.0 cm. 3. Perinephric inflammatory fat stranding about the right kidney consistent with pyelonephritis. 4. Cystic lesion within the mid right kidney has decreased in size since 12/15/2021 though is poorly evaluated without IV contrast. This may represent improving abscess however underlying neoplastic process is not excluded. Recommend continued attention on follow-up. 5. Absent left kidney. 6. Bilateral hydroceles. 7.  Aortic Atherosclerosis  The patient was given 1 g p.o. Tylenol and 1 L  bolus of IV lactated Ringer as well as IV Rocephin.  He will be admitted to a medical telemetry bed for further evaluation and management.  PAST MEDICAL HISTORY:   Past Medical History:  Diagnosis Date   COPD (chronic obstructive pulmonary disease) ([place])    Hypertension     PAST SURGICAL HISTORY:   Past Surgical History:  Procedure Laterality Date   CYSTOSCOPY WITH URETEROSCOPY AND STENT PLACEMENT Right 12/15/2021   Procedure: CYSTOSCOPY WITH RIGHT URETERAL STENT PLACEMENT;  Surgeon: Abbie Sons, MD;  Location: [facility] ORS;  Service: Urology;  Laterality: Right;   HAND SURGERY Left    INNER EAR SURGERY Left     SOCIAL HISTORY:   Social History   Tobacco Use   Smoking status: Every Day    Packs/day: 1.00    Types: Cigarettes   Smokeless tobacco: Never  Substance Use Topics   Alcohol use: Yes    Comment: Occassional     FAMILY HISTORY:  History reviewed. No pertinent family history.  DRUG ALLERGIES:   Allergies  Allergen Reactions   Codeine Other (See Comments)    Tremors   Sulfamethoxazole-Trimethoprim Rash    REVIEW OF SYSTEMS:   ROS As per history of present illness. All pertinent systems were reviewed above. Constitutional, HEENT, cardiovascular, respiratory, GI, GU, musculoskeletal, neuro, psychiatric, endocrine, integumentary and hematologic systems were reviewed and are otherwise negative/unremarkable except for positive findings mentioned above in the HPI.   MEDICATIONS AT HOME:   Prior to Admission medications   Medication  Sig Start Date End Date Taking? Authorizing Provider  metoprolol tartrate (LOPRESSOR) 25 MG tablet Take 1 tablet (25 mg total) by mouth 2 (two) times daily. 12/17/21  Yes Swayze, Ava, DO  nicotine (NICODERM CQ - DOSED IN MG/24 HOURS) 21 mg/24hr patch Place 1 patch (21 mg total) onto the skin daily. 12/18/21  Yes Swayze, Ava, DO  oxybutynin (DITROPAN-XL) 10 MG 24 hr tablet Take 1 tablet (10 mg total) by mouth daily. 12/18/21  Yes  Swayze, Ava, DO  polyethylene glycol (MIRALAX) 17 g packet Take 17 g by mouth daily. 12/17/21  Yes Swayze, Ava, DO  albuterol (VENTOLIN HFA) 108 (90 Base) MCG/ACT inhaler Inhale 2 puffs into the lungs every 6 (six) hours as needed for wheezing or shortness of breath. 12/18/21   Swayze, Ava, DO  oxyCODONE (OXY IR/ROXICODONE) 5 MG immediate release tablet Take 1 tablet (5 mg total) by mouth every 4 (four) hours as needed for moderate pain. 12/17/21   Swayze, Ava, DO      VITAL SIGNS:  Blood pressure 115/82, pulse 75, temperature 98 F (36.7 C), temperature source Oral, resp. rate 18, height '5\' 8"'$  (1.727 m), weight 89.8 kg, SpO2 97 %.  PHYSICAL EXAMINATION:  Physical Exam  GENERAL:  61 y.o.-year-old Caucasian male patient lying in the bed with no acute distress.  EYES: Pupils equal, round, reactive to light and accommodation. No scleral icterus. Extraocular muscles intact.  HEENT: Head atraumatic, normocephalic. Oropharynx and nasopharynx clear.  NECK:  Supple, no jugular venous distention. No thyroid enlargement, no tenderness.  LUNGS: Normal breath sounds bilaterally, no wheezing, rales,rhonchi or crepitation. No use of accessory muscles of respiration.  CARDIOVASCULAR: Regular rate and rhythm, S1, S2 normal. No murmurs, rubs, or gallops.  ABDOMEN: Soft, nondistended, nontender. Bowel sounds present. No organomegaly or mass.  Right CVA tenderness EXTREMITIES: No pedal edema, cyanosis, or clubbing.  NEUROLOGIC: Cranial nerves II through XII are intact. Muscle strength 5/5 in all extremities. Sensation intact. Gait not checked.  PSYCHIATRIC: The patient is alert and oriented x 3.  Normal affect and good eye contact. SKIN: No obvious rash, lesion, or ulcer.   LABORATORY PANEL:   CBC Recent Labs  Lab 01/10/22 2004  WBC 24.3*  HGB 14.0  HCT 43.0  PLT 282   ------------------------------------------------------------------------------------------------------------------  Chemistries   Recent Labs  Lab 01/10/22 2004  NA 132*  K 4.4  CL 101  CO2 18*  GLUCOSE 129*  BUN 28*  CREATININE 1.60*  CALCIUM 9.4   ------------------------------------------------------------------------------------------------------------------  Cardiac Enzymes No results for input(s): "TROPONINI" in the last 168 hours. ------------------------------------------------------------------------------------------------------------------  RADIOLOGY:  CT Renal Stone Study  Result Date: 01/10/2022 CLINICAL DATA:  Right-sided flank pain; recent stent right kidney with stones and infection in August EXAM: CT ABDOMEN AND PELVIS WITHOUT CONTRAST TECHNIQUE: Multidetector CT imaging of the abdomen and pelvis was performed following the standard protocol without IV contrast. RADIATION DOSE REDUCTION: This exam was performed according to the departmental dose-optimization program which includes automated exposure control, adjustment of the mA and/or kV according to patient size and/or use of iterative reconstruction technique. COMPARISON:  CT 12/15/2021 FINDINGS: Lower chest: No acute abnormality. Hepatobiliary: No focal liver abnormality is seen. No gallstones, gallbladder wall thickening, or biliary dilatation. Pancreas: Unremarkable. No pancreatic ductal dilatation or surrounding inflammatory changes. Spleen: Normal in size without focal abnormality. Adrenals/Urinary Tract: Stable bilateral low-attenuation lesions in the adrenal glands compatible with benign adenomas. No follow-up is required. The left kidney is not visualized. Interval placement of a right ureteral  stent. Decreased right hydronephrosis. Multiple right renal stones with the largest measuring 1.0 cm. Marked perinephric stranding. The complex cystic appearing area in the mid right kidney has decreased in size now measuring approximately 1.7 x 2.5 cm. Unremarkable bladder. Stomach/Bowel: Stomach is within normal limits. Appendix appears normal. No  evidence of bowel wall thickening, distention, or inflammatory changes. Vascular/Lymphatic: Aortic atherosclerosis. No enlarged abdominal or pelvic lymph nodes. Reproductive: Prostate is unremarkable.  Large bilateral hydroceles. Other: No free intraperitoneal air. Musculoskeletal: No acute or significant osseous findings. IMPRESSION: 1. Interval placement of a right ureteral stent with decreased right hydronephrosis. 2. Numerous nonobstructing right renal calculi measuring up to 1.0 cm. 3. Perinephric inflammatory fat stranding about the right kidney consistent with pyelonephritis. 4. Cystic lesion within the mid right kidney has decreased in size since 12/15/2021 though is poorly evaluated without IV contrast. This may represent improving abscess however underlying neoplastic process is not excluded. Recommend continued attention on follow-up. 5. Absent left kidney. 6. Bilateral hydroceles. 7.  Aortic Atherosclerosis (ICD10-I70.0). Electronically Signed   By: Placido Sou M.D.   On: 01/10/2022 21:48      IMPRESSION AND PLAN:  Assessment and Plan: * Sepsis due to gram-negative UTI ([place]) -Sepsis is manifested by fever, tachypnea and significant leukocytosis.. - Sepsis is secondary to UTI with acute right pyelonephritis - The patient will be admitted to a medical telemetry bed. - We will continue antibiotic therapy with IV Rocephin. - We will continue hydration with IV normal saline. - We will follow blood and urine cultures.  Ureteral stent present - This is patent right ureteral stent with decreased hydronephrosis. - Follow-up urology consult will be obtained. - Dr. Glori Luis was notified about the patient.  Essential hypertension - We will continue Lopressor.  AKI (acute kidney injury) ([facility]) - The patient will be hydrated with IV normal saline and will follow BMP. - We will avoid nephrotoxins.  COPD (chronic obstructive pulmonary disease) ([facility]) - We will continue her albuterol.   DVT  prophylaxis: Lovenox.  Advanced Care Planning:  Code Status: full code.  Family Communication:  The plan of care was discussed in details with the patient (and family). I answered all questions. The patient agreed to proceed with the above mentioned plan. Further management will depend upon hospital course. Disposition Plan: Back to previous home environment Consults called: Urology All the records are reviewed and case discussed with ED provider.  Status is: Inpatient    At the time of the admission, it appears that the appropriate admission status for this patient is inpatient.  This is judged to be reasonable and necessary in order to provide the required intensity of service to ensure the patient's safety given the presenting symptoms, physical exam findings and initial radiographic and laboratory data in the context of comorbid conditions.  The patient requires inpatient status due to high intensity of service, high risk of further deterioration and high frequency of surveillance required.  I certify that at the time of admission, it is my clinical judgment that the patient will require inpatient hospital care extending more than 2 midnights.                            Dispo: The patient is from: Home              Anticipated d/c is to: Home              Patient currently is not medically  stable to d/c.              Difficult to place patient: No  Christel Mormon M.D on 01/11/2022 at 4:21 AM  [facility]   From 7 PM-7 AM, contact night-coverage [email or web address]  CC: Primary care physician; Pcp, No

## 2022-01-11 NOTE — Assessment & Plan Note (Addendum)
-  Sepsis is manifested by fever, tachypnea and significant leukocytosis.. - Sepsis is secondary to UTI with acute right pyelonephritis - The patient will be admitted to a medical telemetry bed. - We will continue antibiotic therapy with IV Rocephin. - We will continue hydration with IV normal saline. - We will follow blood and urine cultures.

## 2022-01-11 NOTE — Assessment & Plan Note (Signed)
-   We will continue her albuterol. 

## 2022-01-11 NOTE — Consult Note (Signed)
Urology Consult  I have been asked to see the patient by Dr. Sidney Ace, for evaluation and management of right pyelonephritis with sepsis.  Chief Complaint: Right flank pain, fever, chills  History of Present Illness: Willie White is a 61 y.o. year old male with COPD and a solitary right kidney who underwent right ureteral stent placement with Dr. Bernardo Heater on 12/15/2021 for management of an obstructing right UPJ calculus with pyonephrosis scheduled for outpatient ureteroscopy on 01/25/2022 who presented to the ED overnight with reports of right flank pain, fever, and chills.  He was febrile on admission, 38.5 C, but has been normotensive, HR WNL.  Admission labs notable for UA with nitrites, >50 WBCs/hpf, 21-50 RBCs/hpf, many bacteria, and WBC clumps; creatinine 1.60 (baseline 1.1); WBC count 24.3; lactate 1.5; and procalcitonin 9.43.  CT stone study revealed appropriately positioned right ureteral stent with decreased right hydronephrosis compared to prior.  There is right perinephric stranding and his previously seen mid right renal cystic lesion has decreased in size compared to prior.  Urine and blood cultures are pending.  He has been started on empiric antibiotics as below.  Today he reports feeling slightly better than on admission.  Anti-infectives (From admission, onward)    Start     Dose/Rate Route Frequency Ordered Stop   01/10/22 2345  cefTRIAXone (ROCEPHIN) 2 g in sodium chloride 0.9 % 100 mL IVPB        2 g 200 mL/hr over 30 Minutes Intravenous Every 24 hours 01/10/22 2340 01/17/22 2344       Past Medical History:  Diagnosis Date   COPD (chronic obstructive pulmonary disease) ([place])    Hypertension     Past Surgical History:  Procedure Laterality Date   CYSTOSCOPY WITH URETEROSCOPY AND STENT PLACEMENT Right 12/15/2021   Procedure: CYSTOSCOPY WITH RIGHT URETERAL STENT PLACEMENT;  Surgeon: Abbie Sons, MD;  Location: [facility] ORS;  Service: Urology;  Laterality: Right;    HAND SURGERY Left    INNER EAR SURGERY Left     Home Medications:  Current Meds  Medication Sig   metoprolol tartrate (LOPRESSOR) 25 MG tablet Take 1 tablet (25 mg total) by mouth 2 (two) times daily.   nicotine (NICODERM CQ - DOSED IN MG/24 HOURS) 21 mg/24hr patch Place 1 patch (21 mg total) onto the skin daily.   oxybutynin (DITROPAN-XL) 10 MG 24 hr tablet Take 1 tablet (10 mg total) by mouth daily.   polyethylene glycol (MIRALAX) 17 g packet Take 17 g by mouth daily.    Allergies:  Allergies  Allergen Reactions   Codeine Other (See Comments)    Tremors   Sulfamethoxazole-Trimethoprim Rash    History reviewed. No pertinent family history.  Social History:  reports that he has been smoking cigarettes. He has been smoking an average of 1 pack per day. He has never used smokeless tobacco. He reports current alcohol use. He reports that he does not use drugs.  ROS: A complete review of systems was performed.  All systems are negative except for pertinent findings as noted.  Physical Exam:  Vital signs in last 24 hours: Temp:  [98 F (36.7 C)-101.3 F (38.5 C)] 98.5 F (36.9 C) (09/12 0813) Pulse Rate:  [58-79] 79 (09/12 0813) Resp:  [17-26] 17 (09/12 0813) BP: (90-122)/(63-84) 111/63 (09/12 0813) SpO2:  [95 %-98 %] 97 % (09/12 0813) Weight:  [89.8 kg] 89.8 kg (09/11 1957) Constitutional:  Alert and oriented, no acute distress HEENT: [place] AT, moist mucus membranes Cardiovascular:  No clubbing, cyanosis, or edema Respiratory: Normal respiratory effort Skin: No rashes, bruises or suspicious lesions Neurologic: Grossly intact, no focal deficits, moving all 4 extremities Psychiatric: Normal mood and affect  Laboratory Data:  Recent Labs    01/10/22 2004 01/11/22 0608  WBC 24.3* 24.2*  HGB 14.0 12.0*  HCT 43.0 35.5*   Recent Labs    01/10/22 2004 01/11/22 0608  NA 132* 132*  K 4.4 3.8  CL 101 105  CO2 18* 19*  GLUCOSE 129* 124*  BUN 28* 28*  CREATININE 1.60*  1.43*  CALCIUM 9.4 8.5*   Recent Labs    01/11/22 0608  INR 1.3*   Urinalysis    Component Value Date/Time   COLORURINE YELLOW (A) 01/10/2022 2004   APPEARANCEUR CLOUDY (A) 01/10/2022 2004   APPEARANCEUR Clear 12/24/2012 1929   LABSPEC 1.014 01/10/2022 2004   LABSPEC 1.018 12/24/2012 [place] 5.0 01/10/2022 2004   GLUCOSEU NEGATIVE 01/10/2022 2004   GLUCOSEU Negative 12/24/2012 1929   HGBUR SMALL (A) 01/10/2022 2004   BILIRUBINUR NEGATIVE 01/10/2022 2004   BILIRUBINUR Negative 12/24/2012 [place] NEGATIVE 01/10/2022 2004   PROTEINUR 100 (A) 01/10/2022 2004   NITRITE POSITIVE (A) 01/10/2022 2004   LEUKOCYTESUR LARGE (A) 01/10/2022 2004   LEUKOCYTESUR Negative 12/24/2012 1929   Results for orders placed or performed during the hospital encounter of 01/10/22  Blood culture (routine x 2)     Status: None (Preliminary result)   Collection Time: 01/10/22  9:51 PM   Specimen: BLOOD  Result Value Ref Range Status   Specimen Description BLOOD RIGHT ASSIST CONTROL  Final   Special Requests   Final    BOTTLES DRAWN AEROBIC AND ANAEROBIC Blood Culture results may not be optimal due to an excessive volume of blood received in culture bottles   Culture   Final    NO GROWTH < 12 HOURS Performed at [facility], [address], [place], [place] [address]    Report Status PENDING  Incomplete  Blood culture (routine x 2)     Status: None (Preliminary result)   Collection Time: 01/10/22 10:04 PM   Specimen: BLOOD  Result Value Ref Range Status   Specimen Description BLOOD RIGHT WRIST  Final   Special Requests   Final    BOTTLES DRAWN AEROBIC AND ANAEROBIC Blood Culture results may not be optimal due to an excessive volume of blood received in culture bottles   Culture  Setup Time PENDING  Incomplete   Culture   Final    NO GROWTH < 12 HOURS Performed at [facility], [address], [place], [place] [address]    Report Status PENDING  Incomplete     Radiologic Imaging: CT Renal Stone Study  Result Date: 01/10/2022 CLINICAL DATA:  Right-sided flank pain; recent stent right kidney with stones and infection in August EXAM: CT ABDOMEN AND PELVIS WITHOUT CONTRAST TECHNIQUE: Multidetector CT imaging of the abdomen and pelvis was performed following the standard protocol without IV contrast. RADIATION DOSE REDUCTION: This exam was performed according to the departmental dose-optimization program which includes automated exposure control, adjustment of the mA and/or kV according to patient size and/or use of iterative reconstruction technique. COMPARISON:  CT 12/15/2021 FINDINGS: Lower chest: No acute abnormality. Hepatobiliary: No focal liver abnormality is seen. No gallstones, gallbladder wall thickening, or biliary dilatation. Pancreas: Unremarkable. No pancreatic ductal dilatation or surrounding inflammatory changes. Spleen: Normal in size without focal abnormality. Adrenals/Urinary Tract: Stable bilateral low-attenuation lesions in  the adrenal glands compatible with benign adenomas. No follow-up is required. The left kidney is not visualized. Interval placement of a right ureteral stent. Decreased right hydronephrosis. Multiple right renal stones with the largest measuring 1.0 cm. Marked perinephric stranding. The complex cystic appearing area in the mid right kidney has decreased in size now measuring approximately 1.7 x 2.5 cm. Unremarkable bladder. Stomach/Bowel: Stomach is within normal limits. Appendix appears normal. No evidence of bowel wall thickening, distention, or inflammatory changes. Vascular/Lymphatic: Aortic atherosclerosis. No enlarged abdominal or pelvic lymph nodes. Reproductive: Prostate is unremarkable.  Large bilateral hydroceles. Other: No free intraperitoneal air. Musculoskeletal: No acute or significant osseous findings. IMPRESSION: 1. Interval placement of a right ureteral stent with decreased right hydronephrosis. 2. Numerous  nonobstructing right renal calculi measuring up to 1.0 cm. 3. Perinephric inflammatory fat stranding about the right kidney consistent with pyelonephritis. 4. Cystic lesion within the mid right kidney has decreased in size since 12/15/2021 though is poorly evaluated without IV contrast. This may represent improving abscess however underlying neoplastic process is not excluded. Recommend continued attention on follow-up. 5. Absent left kidney. 6. Bilateral hydroceles. 7.  Aortic Atherosclerosis (ICD10-I70.0). Electronically Signed   By: Placido Sou M.D.   On: 01/10/2022 21:48    Assessment & Plan:  61 year old male with a solitary right kidney readmitted with right pyelonephritis and sepsis in the setting of recently diagnosed right UPJ stone with pyonephrosis, s/p right ureteral stent placement.  His stent is in appropriate position on imaging.  No drainable abscess seen.  Agree with empiric antibiotics, no indication for urgent urologic intervention at this time.  Please follow urine cultures, recommend we discussed that we may need to push out his scheduled ureteroscopy to allow more time for antibiotics to clear his infection.  I will coordinate this with Dr. Bernardo Heater and notify the patient.  Patient is in agreement with this plan.  Thank you for involving me in this patient's care, please page with any further questions or concerns.  Debroah Loop, PA-C 01/11/2022 10:48 AM

## 2022-01-11 NOTE — Assessment & Plan Note (Signed)
-   The patient will be hydrated with IV normal saline and will follow BMP. - We will avoid nephrotoxins. 

## 2022-01-11 NOTE — Progress Notes (Signed)
Anticoagulation monitoring(Lovenox):  61 yo male ordered Lovenox 40 mg Q24h    Filed Weights   01/10/22 1957  Weight: 89.8 kg (198 lb)   BMI 30.11   Lab Results  Component Value Date   CREATININE 1.60 (H) 01/10/2022   CREATININE 1.12 12/28/2021   CREATININE 1.30 (H) 12/18/2021   Estimated Creatinine Clearance: 52.1 mL/min (A) (by C-G formula based on SCr of 1.6 mg/dL (H)). Hemoglobin & Hematocrit     Component Value Date/Time   HGB 14.0 01/10/2022 2004   HGB 15.8 12/24/2012 1929   HCT 43.0 01/10/2022 2004   HCT 45.1 12/24/2012 1929     Per Protocol for Patient with estCrcl > 30 ml/min and BMI > 30, will transition to Lovenox 45 mg Q24h.

## 2022-01-11 NOTE — Assessment & Plan Note (Signed)
-   This is patent right ureteral stent with decreased hydronephrosis. - Follow-up urology consult will be obtained. - Dr. Glori Luis was notified about the patient.

## 2022-01-11 NOTE — H&P (View-Only) (Signed)
Urology Consult  I have been asked to see the patient by Dr. Sidney Ace, for evaluation and management of right pyelonephritis with sepsis.  Chief Complaint: Right flank pain, fever, chills  History of Present Illness: NYGEL PROKOP is a 61 y.o. year old male with COPD and a solitary right kidney who underwent right ureteral stent placement with Dr. Bernardo Heater on 12/15/2021 for management of an obstructing right UPJ calculus with pyonephrosis scheduled for outpatient ureteroscopy on 01/25/2022 who presented to the ED overnight with reports of right flank pain, fever, and chills.  He was febrile on admission, 38.5 C, but has been normotensive, HR WNL.  Admission labs notable for UA with nitrites, >50 WBCs/hpf, 21-50 RBCs/hpf, many bacteria, and WBC clumps; creatinine 1.60 (baseline 1.1); WBC count 24.3; lactate 1.5; and procalcitonin 9.43.  CT stone study revealed appropriately positioned right ureteral stent with decreased right hydronephrosis compared to prior.  There is right perinephric stranding and his previously seen mid right renal cystic lesion has decreased in size compared to prior.  Urine and blood cultures are pending.  He has been started on empiric antibiotics as below.  Today he reports feeling slightly better than on admission.  Anti-infectives (From admission, onward)    Start     Dose/Rate Route Frequency Ordered Stop   01/10/22 2345  cefTRIAXone (ROCEPHIN) 2 g in sodium chloride 0.9 % 100 mL IVPB        2 g 200 mL/hr over 30 Minutes Intravenous Every 24 hours 01/10/22 2340 01/17/22 2344       Past Medical History:  Diagnosis Date   COPD (chronic obstructive pulmonary disease) ([place])    Hypertension     Past Surgical History:  Procedure Laterality Date   CYSTOSCOPY WITH URETEROSCOPY AND STENT PLACEMENT Right 12/15/2021   Procedure: CYSTOSCOPY WITH RIGHT URETERAL STENT PLACEMENT;  Surgeon: Abbie Sons, MD;  Location: [facility] ORS;  Service: Urology;  Laterality: Right;    HAND SURGERY Left    INNER EAR SURGERY Left     Home Medications:  Current Meds  Medication Sig   metoprolol tartrate (LOPRESSOR) 25 MG tablet Take 1 tablet (25 mg total) by mouth 2 (two) times daily.   nicotine (NICODERM CQ - DOSED IN MG/24 HOURS) 21 mg/24hr patch Place 1 patch (21 mg total) onto the skin daily.   oxybutynin (DITROPAN-XL) 10 MG 24 hr tablet Take 1 tablet (10 mg total) by mouth daily.   polyethylene glycol (MIRALAX) 17 g packet Take 17 g by mouth daily.    Allergies:  Allergies  Allergen Reactions   Codeine Other (See Comments)    Tremors   Sulfamethoxazole-Trimethoprim Rash    History reviewed. No pertinent family history.  Social History:  reports that he has been smoking cigarettes. He has been smoking an average of 1 pack per day. He has never used smokeless tobacco. He reports current alcohol use. He reports that he does not use drugs.  ROS: A complete review of systems was performed.  All systems are negative except for pertinent findings as noted.  Physical Exam:  Vital signs in last 24 hours: Temp:  [98 F (36.7 C)-101.3 F (38.5 C)] 98.5 F (36.9 C) (09/12 0813) Pulse Rate:  [58-79] 79 (09/12 0813) Resp:  [17-26] 17 (09/12 0813) BP: (90-122)/(63-84) 111/63 (09/12 0813) SpO2:  [95 %-98 %] 97 % (09/12 0813) Weight:  [89.8 kg] 89.8 kg (09/11 1957) Constitutional:  Alert and oriented, no acute distress HEENT: [place] AT, moist mucus membranes Cardiovascular:  No clubbing, cyanosis, or edema Respiratory: Normal respiratory effort Skin: No rashes, bruises or suspicious lesions Neurologic: Grossly intact, no focal deficits, moving all 4 extremities Psychiatric: Normal mood and affect  Laboratory Data:  Recent Labs    01/10/22 2004 01/11/22 0608  WBC 24.3* 24.2*  HGB 14.0 12.0*  HCT 43.0 35.5*   Recent Labs    01/10/22 2004 01/11/22 0608  NA 132* 132*  K 4.4 3.8  CL 101 105  CO2 18* 19*  GLUCOSE 129* 124*  BUN 28* 28*  CREATININE 1.60*  1.43*  CALCIUM 9.4 8.5*   Recent Labs    01/11/22 0608  INR 1.3*   Urinalysis    Component Value Date/Time   COLORURINE YELLOW (A) 01/10/2022 2004   APPEARANCEUR CLOUDY (A) 01/10/2022 2004   APPEARANCEUR Clear 12/24/2012 1929   LABSPEC 1.014 01/10/2022 2004   LABSPEC 1.018 12/24/2012 [place] 5.0 01/10/2022 2004   GLUCOSEU NEGATIVE 01/10/2022 2004   GLUCOSEU Negative 12/24/2012 1929   HGBUR SMALL (A) 01/10/2022 2004   BILIRUBINUR NEGATIVE 01/10/2022 2004   BILIRUBINUR Negative 12/24/2012 [place] NEGATIVE 01/10/2022 2004   PROTEINUR 100 (A) 01/10/2022 2004   NITRITE POSITIVE (A) 01/10/2022 2004   LEUKOCYTESUR LARGE (A) 01/10/2022 2004   LEUKOCYTESUR Negative 12/24/2012 1929   Results for orders placed or performed during the hospital encounter of 01/10/22  Blood culture (routine x 2)     Status: None (Preliminary result)   Collection Time: 01/10/22  9:51 PM   Specimen: BLOOD  Result Value Ref Range Status   Specimen Description BLOOD RIGHT ASSIST CONTROL  Final   Special Requests   Final    BOTTLES DRAWN AEROBIC AND ANAEROBIC Blood Culture results may not be optimal due to an excessive volume of blood received in culture bottles   Culture   Final    NO GROWTH < 12 HOURS Performed at [facility], [address], [place], [place] [address]    Report Status PENDING  Incomplete  Blood culture (routine x 2)     Status: None (Preliminary result)   Collection Time: 01/10/22 10:04 PM   Specimen: BLOOD  Result Value Ref Range Status   Specimen Description BLOOD RIGHT WRIST  Final   Special Requests   Final    BOTTLES DRAWN AEROBIC AND ANAEROBIC Blood Culture results may not be optimal due to an excessive volume of blood received in culture bottles   Culture  Setup Time PENDING  Incomplete   Culture   Final    NO GROWTH < 12 HOURS Performed at [facility], [address] [place], [place] [address]    Report Status PENDING  Incomplete     Radiologic Imaging: CT Renal Stone Study  Result Date: 01/10/2022 CLINICAL DATA:  Right-sided flank pain; recent stent right kidney with stones and infection in August EXAM: CT ABDOMEN AND PELVIS WITHOUT CONTRAST TECHNIQUE: Multidetector CT imaging of the abdomen and pelvis was performed following the standard protocol without IV contrast. RADIATION DOSE REDUCTION: This exam was performed according to the departmental dose-optimization program which includes automated exposure control, adjustment of the mA and/or kV according to patient size and/or use of iterative reconstruction technique. COMPARISON:  CT 12/15/2021 FINDINGS: Lower chest: No acute abnormality. Hepatobiliary: No focal liver abnormality is seen. No gallstones, gallbladder wall thickening, or biliary dilatation. Pancreas: Unremarkable. No pancreatic ductal dilatation or surrounding inflammatory changes. Spleen: Normal in size without focal abnormality. Adrenals/Urinary Tract: Stable bilateral low-attenuation lesions in  the adrenal glands compatible with benign adenomas. No follow-up is required. The left kidney is not visualized. Interval placement of a right ureteral stent. Decreased right hydronephrosis. Multiple right renal stones with the largest measuring 1.0 cm. Marked perinephric stranding. The complex cystic appearing area in the mid right kidney has decreased in size now measuring approximately 1.7 x 2.5 cm. Unremarkable bladder. Stomach/Bowel: Stomach is within normal limits. Appendix appears normal. No evidence of bowel wall thickening, distention, or inflammatory changes. Vascular/Lymphatic: Aortic atherosclerosis. No enlarged abdominal or pelvic lymph nodes. Reproductive: Prostate is unremarkable.  Large bilateral hydroceles. Other: No free intraperitoneal air. Musculoskeletal: No acute or significant osseous findings. IMPRESSION: 1. Interval placement of a right ureteral stent with decreased right hydronephrosis. 2. Numerous  nonobstructing right renal calculi measuring up to 1.0 cm. 3. Perinephric inflammatory fat stranding about the right kidney consistent with pyelonephritis. 4. Cystic lesion within the mid right kidney has decreased in size since 12/15/2021 though is poorly evaluated without IV contrast. This may represent improving abscess however underlying neoplastic process is not excluded. Recommend continued attention on follow-up. 5. Absent left kidney. 6. Bilateral hydroceles. 7.  Aortic Atherosclerosis (ICD10-I70.0). Electronically Signed   By: Placido Sou M.D.   On: 01/10/2022 21:48    Assessment & Plan:  61 year old male with a solitary right kidney readmitted with right pyelonephritis and sepsis in the setting of recently diagnosed right UPJ stone with pyonephrosis, s/p right ureteral stent placement.  His stent is in appropriate position on imaging.  No drainable abscess seen.  Agree with empiric antibiotics, no indication for urgent urologic intervention at this time.  Please follow urine cultures, recommend we discussed that we may need to push out his scheduled ureteroscopy to allow more time for antibiotics to clear his infection.  I will coordinate this with Dr. Bernardo Heater and notify the patient.  Patient is in agreement with this plan.  Thank you for involving me in this patient's care, please page with any further questions or concerns.  Debroah Loop, PA-C 01/11/2022 10:48 AM

## 2022-01-11 NOTE — Progress Notes (Signed)
Brief hospitalist update note.  This is a nonbillable note.  Please see same-day H&P for full billable details.  Briefly, this is a 61 year old male with history of solitary right kidney admitted with pyelonephritis and associated sepsis.  This is in the setting of recently diagnosed UPJ stone with pyelonephrosis status post right ureteral stent placement.  Seen by urology in consultation.  Stent is in appropriate positioning.  No drainable abscess found.  Patient is on empiric antibiotics.  Plan: Continue empiric antibiotics Follow urine cultures De-escalate antibiotics as appropriate Monitor vitals and fever curve Multimodal pain control  Ralene Muskrat MD  No charge

## 2022-01-12 ENCOUNTER — Inpatient Hospital Stay: Admission: RE | Admit: 2022-01-12 | Payer: Self-pay | Source: Ambulatory Visit

## 2022-01-12 ENCOUNTER — Encounter: Payer: Self-pay | Admitting: Family Medicine

## 2022-01-12 DIAGNOSIS — Z960 Presence of urogenital implants: Secondary | ICD-10-CM

## 2022-01-12 LAB — BLOOD CULTURE ID PANEL (REFLEXED) - BCID2

## 2022-01-12 LAB — BASIC METABOLIC PANEL
Anion gap: 6 (ref 5–15)
BUN: 21 mg/dL (ref 8–23)
CO2: 22 mmol/L (ref 22–32)
Calcium: 8.4 mg/dL — ABNORMAL LOW (ref 8.9–10.3)
Chloride: 108 mmol/L (ref 98–111)
Creatinine, Ser: 1.39 mg/dL — ABNORMAL HIGH (ref 0.61–1.24)
GFR, Estimated: 57 mL/min — ABNORMAL LOW (ref >60)
Glucose, Bld: 112 mg/dL — ABNORMAL HIGH (ref 70–99)
Potassium: 3.8 mmol/L (ref 3.5–5.1)
Sodium: 136 mmol/L (ref 135–145)

## 2022-01-12 LAB — CBC WITH DIFFERENTIAL/PLATELET
Abs Immature Granulocytes: 0.07 10*3/uL (ref 0.00–0.07)
Basophils Absolute: 0.1 10*3/uL (ref 0.0–0.1)
Basophils Relative: 0 %
Eosinophils Absolute: 0.2 10*3/uL (ref 0.0–0.5)
Eosinophils Relative: 2 %
HCT: 33.3 % — ABNORMAL LOW (ref 39.0–52.0)
Hemoglobin: 11 g/dL — ABNORMAL LOW (ref 13.0–17.0)
Immature Granulocytes: 1 %
Lymphocytes Relative: 10 %
Lymphs Abs: 1.3 10*3/uL (ref 0.7–4.0)
MCH: 30.4 pg (ref 26.0–34.0)
MCHC: 33 g/dL (ref 30.0–36.0)
MCV: 92 fL (ref 80.0–100.0)
Monocytes Absolute: 1.5 10*3/uL — ABNORMAL HIGH (ref 0.1–1.0)
Monocytes Relative: 12 %
Neutro Abs: 9.7 10*3/uL — ABNORMAL HIGH (ref 1.7–7.7)
Neutrophils Relative %: 75 %
Platelets: 201 10*3/uL (ref 150–400)
RBC: 3.62 MIL/uL — ABNORMAL LOW (ref 4.22–5.81)
RDW: 13.8 % (ref 11.5–15.5)
WBC: 12.7 10*3/uL — ABNORMAL HIGH (ref 4.0–10.5)
nRBC: 0 % (ref 0.0–0.2)

## 2022-01-12 NOTE — Progress Notes (Signed)
Met with the patient and [facility]. [facility] I explained t him how to get the appointment He lives alone and is getting SS He drives, he is ambulatory and is able to do ADLs on his own I explained to him about Medication Mgt and where to get his meds other than narcotics, He stated that he has no additional needs

## 2022-01-12 NOTE — Progress Notes (Signed)
PHARMACY - PHYSICIAN COMMUNICATION CRITICAL VALUE ALERT - BLOOD CULTURE IDENTIFICATION (BCID)  Willie White is an 61 y.o. male who presented to [facility] on 01/10/2022 with a chief complaint of Sepsis due to UTI  Assessment: E Coli in 1 of 4 bottles, no resistance detected (include suspected source if known)  Name of physician (or Provider) Contacted: [facility]  Current antibiotics: Ceftriaxone 2 gm IV Q24H   Changes to prescribed antibiotics recommended:  Patient is on recommended antibiotics - No changes needed  Results for orders placed or performed during the hospital encounter of 01/10/22  Blood Culture ID Panel (Reflexed) (Collected: 01/10/2022  9:51 PM)  Result Value Ref Range   Enterococcus faecalis NOT DETECTED NOT DETECTED   Enterococcus Faecium NOT DETECTED NOT DETECTED   Listeria monocytogenes NOT DETECTED NOT DETECTED   Staphylococcus species NOT DETECTED NOT DETECTED   Staphylococcus aureus (BCID) NOT DETECTED NOT DETECTED   Staphylococcus epidermidis NOT DETECTED NOT DETECTED   Staphylococcus lugdunensis NOT DETECTED NOT DETECTED   Streptococcus species NOT DETECTED NOT DETECTED   Streptococcus agalactiae NOT DETECTED NOT DETECTED   Streptococcus pneumoniae NOT DETECTED NOT DETECTED   Streptococcus pyogenes NOT DETECTED NOT DETECTED   A.calcoaceticus-baumannii NOT DETECTED NOT DETECTED   Bacteroides fragilis NOT DETECTED NOT DETECTED   Enterobacterales DETECTED (A) NOT DETECTED   Enterobacter cloacae complex NOT DETECTED NOT DETECTED   Escherichia coli DETECTED (A) NOT DETECTED   Klebsiella aerogenes NOT DETECTED NOT DETECTED   Klebsiella oxytoca NOT DETECTED NOT DETECTED   Klebsiella pneumoniae NOT DETECTED NOT DETECTED   Proteus species NOT DETECTED NOT DETECTED   Salmonella species NOT DETECTED NOT DETECTED   Serratia marcescens NOT DETECTED NOT DETECTED   Haemophilus influenzae NOT DETECTED NOT DETECTED   Neisseria meningitidis NOT DETECTED NOT DETECTED    Pseudomonas aeruginosa NOT DETECTED NOT DETECTED   Stenotrophomonas maltophilia NOT DETECTED NOT DETECTED   Candida albicans NOT DETECTED NOT DETECTED   Candida auris NOT DETECTED NOT DETECTED   Candida glabrata NOT DETECTED NOT DETECTED   Candida krusei NOT DETECTED NOT DETECTED   Candida parapsilosis NOT DETECTED NOT DETECTED   Candida tropicalis NOT DETECTED NOT DETECTED   Cryptococcus neoformans/gattii NOT DETECTED NOT DETECTED   CTX-M ESBL NOT DETECTED NOT DETECTED   Carbapenem resistance IMP NOT DETECTED NOT DETECTED   Carbapenem resistance KPC NOT DETECTED NOT DETECTED   Carbapenem resistance NDM NOT DETECTED NOT DETECTED   Carbapenem resist OXA 48 LIKE NOT DETECTED NOT DETECTED   Carbapenem resistance VIM NOT DETECTED NOT DETECTED    Charod Slawinski D 01/12/2022  7:11 AM

## 2022-01-12 NOTE — Progress Notes (Addendum)
Progress Note    Willie White  MOQ:[id number] DOB: 02-15-1961  DOA: 01/10/2022 PCP: Pcp, No      Brief Narrative:    Medical records reviewed and are as summarized below:  Willie White is a 61 y.o. male with medical history significant for COPD, hypertension, recent right ureteral stent placement on 12/15/2021, who presented to the hospital with right flank pain, dysuria, increased frequency and urgency of micturition, fever, chills. He was febrile in the emergency room with temperature of 101.3 F.      Assessment/Plan:   Principal Problem:   Sepsis due to gram-negative UTI (HCC) Active Problems:   COPD (chronic obstructive pulmonary disease) (HCC)   AKI (acute kidney injury) ([place])   Essential hypertension   Ureteral stent present    Body mass index is 30.11 kg/m.  (Obesity)  Sepsis secondary to acute complicated UTI, E. coli bacteremia: Urine cultures growing gram-negative rods and blood culture showed E. coli.  Follow-up sensitivity report.  Continue IV ceftriaxone.  Right ureteral stone s/p right ureteral stent placement on 12/15/2021.  Outpatient follow-up with urologist for ureteroscopy.  AKI: Creatinine is improving.  Discontinue IV fluids to avoid fluid overload.  Repeat BMP tomorrow.  Other comorbidities include hypertension, COPD    Diet Order             Diet Heart Room service appropriate? Yes; Fluid consistency: Thin  Diet effective now                            Consultants: Urologist  Procedures: None    Medications:    enoxaparin (LOVENOX) injection  0.5 mg/kg Subcutaneous Q24H   metoprolol tartrate  25 mg Oral BID   nicotine  21 mg Transdermal Daily   oxybutynin  10 mg Oral Daily   polyethylene glycol  17 g Oral Daily   Continuous Infusions:  sodium chloride 100 mL/hr at 01/12/22 1459   cefTRIAXone (ROCEPHIN)  IV 2 g (01/11/22 2325)     Anti-infectives (From admission, onward)    Start     Dose/Rate  Route Frequency Ordered Stop   01/10/22 2345  cefTRIAXone (ROCEPHIN) 2 g in sodium chloride 0.9 % 100 mL IVPB        2 g 200 mL/hr over 30 Minutes Intravenous Every 24 hours 01/10/22 2340 01/17/22 2344              Family Communication/Anticipated D/C date and plan/Code Status   DVT prophylaxis:      Code Status: Full Code  Family Communication: None Disposition Plan: Plan to discharge home tomorrow   Status is: Inpatient Remains inpatient appropriate because: Awaiting urine and blood culture sensitivity report       Subjective:   Interval events noted.  He feels better today.  No shortness of breath, abdominal pain, dysuria, hematuria  Objective:    Vitals:   01/11/22 1955 01/11/22 2303 01/12/22 0830 01/12/22 1513  BP: 116/75 99/74 106/77 96/66  Pulse: 89 69 67 72  Resp:  '17 19 19  '$ Temp:  98.6 F (37 C) 98.7 F (37.1 C) 98.5 F (36.9 C)  TempSrc:   Oral   SpO2:  94% 98% 97%  Weight:      Height:       No data found.   Intake/Output Summary (Last 24 hours) at 01/12/2022 1541 Last data filed at 01/12/2022 1500 Gross per 24 hour  Intake 1187.36 ml  Output 2700 ml  Net -1512.64 ml   Filed Weights   01/10/22 1957  Weight: 89.8 kg    Exam:  GEN: NAD SKIN: Warm and dry EYES: No pallor or icterus ENT: MMM CV: RRR PULM: CTA B ABD: soft, ND, NT, +BS CNS: AAO x 3, non focal EXT: No edema or tenderness        Data Reviewed:   I have personally reviewed following labs and imaging studies:  Labs: Labs show the following:   Basic Metabolic Panel: Recent Labs  Lab 01/10/22 2004 01/11/22 0608 01/12/22 0823  NA 132* 132* 136  K 4.4 3.8 3.8  CL 101 105 108  CO2 18* 19* 22  GLUCOSE 129* 124* 112*  BUN 28* 28* 21  CREATININE 1.60* 1.43* 1.39*  CALCIUM 9.4 8.5* 8.4*   GFR Estimated Creatinine Clearance: 60 mL/min (A) (by C-G formula based on SCr of 1.39 mg/dL (H)). Liver Function Tests: No results for input(s): "AST", "ALT",  "ALKPHOS", "BILITOT", "PROT", "ALBUMIN" in the last 168 hours. No results for input(s): "LIPASE", "AMYLASE" in the last 168 hours. No results for input(s): "AMMONIA" in the last 168 hours. Coagulation profile Recent Labs  Lab 01/11/22 0608  INR 1.3*    CBC: Recent Labs  Lab 01/10/22 2004 01/11/22 0608 01/12/22 0823  WBC 24.3* 24.2* 12.7*  NEUTROABS  --   --  9.7*  HGB 14.0 12.0* 11.0*  HCT 43.0 35.5* 33.3*  MCV 92.1 91.3 92.0  PLT 282 231 201   Cardiac Enzymes: No results for input(s): "CKTOTAL", "CKMB", "CKMBINDEX", "TROPONINI" in the last 168 hours. BNP (last 3 results) No results for input(s): "PROBNP" in the last 8760 hours. CBG: No results for input(s): "GLUCAP" in the last 168 hours. D-Dimer: No results for input(s): "DDIMER" in the last 72 hours. Hgb A1c: No results for input(s): "HGBA1C" in the last 72 hours. Lipid Profile: No results for input(s): "CHOL", "HDL", "LDLCALC", "TRIG", "CHOLHDL", "LDLDIRECT" in the last 72 hours. Thyroid function studies: No results for input(s): "TSH", "T4TOTAL", "T3FREE", "THYROIDAB" in the last 72 hours.  Invalid input(s): "FREET3" Anemia work up: No results for input(s): "VITAMINB12", "FOLATE", "FERRITIN", "TIBC", "IRON", "RETICCTPCT" in the last 72 hours. Sepsis Labs: Recent Labs  Lab 01/10/22 2004 01/10/22 2204 01/11/22 0608 01/12/22 0823  PROCALCITON  --   --  9.43  --   WBC 24.3*  --  24.2* 12.7*  LATICACIDVEN  --  1.5  --   --     Microbiology Recent Results (from the past 240 hour(s))  Urine Culture     Status: Abnormal (Preliminary result)   Collection Time: 01/10/22  8:02 PM   Specimen: Urine, Random  Result Value Ref Range Status   Specimen Description   Final    URINE, RANDOM Performed at [facility], [address] [place], [place] [address]    Special Requests   Final    NONE Performed at [facility], [address], [place], [place] [address]    Culture >=100,000  COLONIES/mL GRAM NEGATIVE RODS (A)  Final   Report Status PENDING  Incomplete  Blood culture (routine x 2)     Status: None (Preliminary result)   Collection Time: 01/10/22  9:51 PM   Specimen: BLOOD  Result Value Ref Range Status   Specimen Description   Final    BLOOD RIGHT ASSIST CONTROL Performed at [facility], [address] [place], [place] [address]    Special Requests   Final  BOTTLES DRAWN AEROBIC AND ANAEROBIC Blood Culture results may not be optimal due to an excessive volume of blood received in culture bottles Performed at [facility], [address] [place], [place] [address]    Culture  Setup Time   Final    GRAM NEGATIVE RODS ANAEROBIC BOTTLE ONLY CRITICAL RESULT CALLED TO, READ BACK BY AND VERIFIED WITH: JASON ROBBINS @ 2355 01/12/22 LFD Performed at [facility], [place] [address], [place], [place] [address]    Culture GRAM NEGATIVE RODS  Final   Report Status PENDING  Incomplete  Blood Culture ID Panel (Reflexed)     Status: Abnormal   Collection Time: 01/10/22  9:51 PM  Result Value Ref Range Status   Enterococcus faecalis NOT DETECTED NOT DETECTED Final   Enterococcus Faecium NOT DETECTED NOT DETECTED Final   Listeria monocytogenes NOT DETECTED NOT DETECTED Final   Staphylococcus species NOT DETECTED NOT DETECTED Final   Staphylococcus aureus (BCID) NOT DETECTED NOT DETECTED Final   Staphylococcus epidermidis NOT DETECTED NOT DETECTED Final   Staphylococcus lugdunensis NOT DETECTED NOT DETECTED Final   Streptococcus species NOT DETECTED NOT DETECTED Final   Streptococcus agalactiae NOT DETECTED NOT DETECTED Final   Streptococcus pneumoniae NOT DETECTED NOT DETECTED Final   Streptococcus pyogenes NOT DETECTED NOT DETECTED Final   A.calcoaceticus-baumannii NOT DETECTED NOT DETECTED Final   Bacteroides fragilis NOT DETECTED NOT DETECTED Final   Enterobacterales DETECTED (A) NOT DETECTED Final    Comment: Enterobacterales  represent a large order of gram negative bacteria, not a single organism. CRITICAL RESULT CALLED TO, READ BACK BY AND VERIFIED WITH: JASON ROBBINS @ 0645 01/12/22 LFD    Enterobacter cloacae complex NOT DETECTED NOT DETECTED Final   Escherichia coli DETECTED (A) NOT DETECTED Final    Comment: CRITICAL RESULT CALLED TO, READ BACK BY AND VERIFIED WITH: JASON ROBBINS@ 0645 01/12/22 LFD    Klebsiella aerogenes NOT DETECTED NOT DETECTED Final   Klebsiella oxytoca NOT DETECTED NOT DETECTED Final   Klebsiella pneumoniae NOT DETECTED NOT DETECTED Final   Proteus species NOT DETECTED NOT DETECTED Final   Salmonella species NOT DETECTED NOT DETECTED Final   Serratia marcescens NOT DETECTED NOT DETECTED Final   Haemophilus influenzae NOT DETECTED NOT DETECTED Final   Neisseria meningitidis NOT DETECTED NOT DETECTED Final   Pseudomonas aeruginosa NOT DETECTED NOT DETECTED Final   Stenotrophomonas maltophilia NOT DETECTED NOT DETECTED Final   Candida albicans NOT DETECTED NOT DETECTED Final   Candida auris NOT DETECTED NOT DETECTED Final   Candida glabrata NOT DETECTED NOT DETECTED Final   Candida krusei NOT DETECTED NOT DETECTED Final   Candida parapsilosis NOT DETECTED NOT DETECTED Final   Candida tropicalis NOT DETECTED NOT DETECTED Final   Cryptococcus neoformans/gattii NOT DETECTED NOT DETECTED Final   CTX-M ESBL NOT DETECTED NOT DETECTED Final   Carbapenem resistance IMP NOT DETECTED NOT DETECTED Final   Carbapenem resistance KPC NOT DETECTED NOT DETECTED Final   Carbapenem resistance NDM NOT DETECTED NOT DETECTED Final   Carbapenem resist OXA 48 LIKE NOT DETECTED NOT DETECTED Final   Carbapenem resistance VIM NOT DETECTED NOT DETECTED Final    Comment: Performed at [facility], [address] [place], [place] [address]  Blood culture (routine x 2)     Status: None (Preliminary result)   Collection Time: 01/10/22 10:04 PM   Specimen: BLOOD  Result Value Ref Range Status    Specimen Description BLOOD RIGHT WRIST  Final   Special Requests  Final    BOTTLES DRAWN AEROBIC AND ANAEROBIC Blood Culture results may not be optimal due to an excessive volume of blood received in culture bottles   Culture  Setup Time PENDING  Incomplete   Culture   Final    NO GROWTH 2 DAYS Performed at [facility], [address], [place], [place] [address]    Report Status PENDING  Incomplete    Procedures and diagnostic studies:  CT Renal Stone Study  Result Date: 01/10/2022 CLINICAL DATA:  Right-sided flank pain; recent stent right kidney with stones and infection in August EXAM: CT ABDOMEN AND PELVIS WITHOUT CONTRAST TECHNIQUE: Multidetector CT imaging of the abdomen and pelvis was performed following the standard protocol without IV contrast. RADIATION DOSE REDUCTION: This exam was performed according to the departmental dose-optimization program which includes automated exposure control, adjustment of the mA and/or kV according to patient size and/or use of iterative reconstruction technique. COMPARISON:  CT 12/15/2021 FINDINGS: Lower chest: No acute abnormality. Hepatobiliary: No focal liver abnormality is seen. No gallstones, gallbladder wall thickening, or biliary dilatation. Pancreas: Unremarkable. No pancreatic ductal dilatation or surrounding inflammatory changes. Spleen: Normal in size without focal abnormality. Adrenals/Urinary Tract: Stable bilateral low-attenuation lesions in the adrenal glands compatible with benign adenomas. No follow-up is required. The left kidney is not visualized. Interval placement of a right ureteral stent. Decreased right hydronephrosis. Multiple right renal stones with the largest measuring 1.0 cm. Marked perinephric stranding. The complex cystic appearing area in the mid right kidney has decreased in size now measuring approximately 1.7 x 2.5 cm. Unremarkable bladder. Stomach/Bowel: Stomach is within normal limits. Appendix appears normal.  No evidence of bowel wall thickening, distention, or inflammatory changes. Vascular/Lymphatic: Aortic atherosclerosis. No enlarged abdominal or pelvic lymph nodes. Reproductive: Prostate is unremarkable.  Large bilateral hydroceles. Other: No free intraperitoneal air. Musculoskeletal: No acute or significant osseous findings. IMPRESSION: 1. Interval placement of a right ureteral stent with decreased right hydronephrosis. 2. Numerous nonobstructing right renal calculi measuring up to 1.0 cm. 3. Perinephric inflammatory fat stranding about the right kidney consistent with pyelonephritis. 4. Cystic lesion within the mid right kidney has decreased in size since 12/15/2021 though is poorly evaluated without IV contrast. This may represent improving abscess however underlying neoplastic process is not excluded. Recommend continued attention on follow-up. 5. Absent left kidney. 6. Bilateral hydroceles. 7.  Aortic Atherosclerosis (ICD10-I70.0). Electronically Signed   By: Placido Sou M.D.   On: 01/10/2022 21:48               LOS: 2 days   Marquasia Schmieder  [facility]   Pager on [email or web address]. If 7PM-7AM, please contact night-coverage at [email or web address]     01/12/2022, 3:41 PM

## 2022-01-13 ENCOUNTER — Other Ambulatory Visit: Payer: Self-pay

## 2022-01-13 ENCOUNTER — Encounter: Payer: Self-pay | Admitting: Urology

## 2022-01-13 LAB — CBC WITH DIFFERENTIAL/PLATELET
Abs Immature Granulocytes: 0.06 10*3/uL (ref 0.00–0.07)
Basophils Absolute: 0.1 10*3/uL (ref 0.0–0.1)
Basophils Relative: 1 %
Eosinophils Absolute: 0.3 10*3/uL (ref 0.0–0.5)
Eosinophils Relative: 3 %
HCT: 34.5 % — ABNORMAL LOW (ref 39.0–52.0)
Hemoglobin: 11.4 g/dL — ABNORMAL LOW (ref 13.0–17.0)
Immature Granulocytes: 1 %
Lymphocytes Relative: 10 %
Lymphs Abs: 1 10*3/uL (ref 0.7–4.0)
MCH: 30.3 pg (ref 26.0–34.0)
MCHC: 33 g/dL (ref 30.0–36.0)
MCV: 91.8 fL (ref 80.0–100.0)
Monocytes Absolute: 1.4 10*3/uL — ABNORMAL HIGH (ref 0.1–1.0)
Monocytes Relative: 14 %
Neutro Abs: 7 10*3/uL (ref 1.7–7.7)
Neutrophils Relative %: 71 %
Platelets: 203 10*3/uL (ref 150–400)
RBC: 3.76 MIL/uL — ABNORMAL LOW (ref 4.22–5.81)
RDW: 13.9 % (ref 11.5–15.5)
WBC: 9.8 10*3/uL (ref 4.0–10.5)
nRBC: 0 % (ref 0.0–0.2)

## 2022-01-13 LAB — BASIC METABOLIC PANEL
Anion gap: 7 (ref 5–15)
BUN: 17 mg/dL (ref 8–23)
CO2: 22 mmol/L (ref 22–32)
Calcium: 8.5 mg/dL — ABNORMAL LOW (ref 8.9–10.3)
Chloride: 107 mmol/L (ref 98–111)
Creatinine, Ser: 1.3 mg/dL — ABNORMAL HIGH (ref 0.61–1.24)
GFR, Estimated: >60 mL/min (ref >60)
Glucose, Bld: 112 mg/dL — ABNORMAL HIGH (ref 70–99)
Potassium: 3.8 mmol/L (ref 3.5–5.1)
Sodium: 136 mmol/L (ref 135–145)

## 2022-01-13 LAB — URINE CULTURE: Culture: >=100000 — AB

## 2022-01-13 MED ORDER — LEVOFLOXACIN 500 MG PO TABS
750.0000 mg | ORAL_TABLET | Freq: Every day | ORAL | Status: DC
  Administered 2022-01-13: 750 mg via ORAL
  Filled 2022-01-13: qty 2

## 2022-01-13 MED ORDER — LEVOFLOXACIN 750 MG PO TABS
750.0000 mg | ORAL_TABLET | Freq: Every day | ORAL | 0 refills | Status: DC
  Filled 2022-01-13: qty 1, 1d supply, fill #0
  Filled 2022-01-13: qty 6, 6d supply, fill #0

## 2022-01-13 NOTE — Plan of Care (Signed)
  Problem: Respiratory: Goal: Ability to maintain adequate ventilation will improve Outcome: Progressing   Problem: Clinical Measurements: Goal: Ability to maintain clinical measurements within normal limits will improve Outcome: Progressing   Problem: Activity: Goal: Risk for activity intolerance will decrease Outcome: Progressing   Problem: Clinical Measurements: Goal: Diagnostic test results will improve Outcome: Progressing

## 2022-01-13 NOTE — Discharge Summary (Signed)
Physician Discharge Summary   Patient: Willie White MRN: [id number] DOB: 1960/09/11  Admit date:     01/10/2022  Discharge date: {dischdate:26783}  Discharge Physician: Jennye Boroughs   PCP: Pcp, No   Recommendations at discharge:  {Tip this will not be part of the note when signed- Example include specific recommendations for outpatient follow-up, pending tests to follow-up on. (Optional):26781}  ***  Discharge Diagnoses: Principal Problem:   Sepsis due to gram-negative UTI ([place]) Active Problems:   COPD (chronic obstructive pulmonary disease) (HCC)   AKI (acute kidney injury) ([place])   Essential hypertension   Ureteral stent present  Resolved Problems:   * No resolved hospital problems. *  Hospital Course: No notes on file  Assessment and Plan: * Sepsis due to gram-negative UTI ([place]) -Sepsis is manifested by fever, tachypnea and significant leukocytosis.. - Sepsis is secondary to UTI with acute right pyelonephritis - The patient will be admitted to a medical telemetry bed. - We will continue antibiotic therapy with IV Rocephin. - We will continue hydration with IV normal saline. - We will follow blood and urine cultures.  Ureteral stent present - This is patent right ureteral stent with decreased hydronephrosis. - Follow-up urology consult will be obtained. - Dr. Glori Luis was notified about the patient.  Essential hypertension - We will continue Lopressor.  AKI (acute kidney injury) ([place]) - The patient will be hydrated with IV normal saline and will follow BMP. - We will avoid nephrotoxins.  COPD (chronic obstructive pulmonary disease) (HCC) - We will continue her albuterol.      {Tip this will not be part of the note when signed Body mass index is 30.11 kg/m. , ,  (Optional):26781}  {(NOTE) Pain control PDMP Statment (Optional):26782} Consultants: *** Procedures performed: ***  Disposition: {Plan; Disposition:26390} Diet recommendation:  Discharge Diet  Orders (From admission, onward)     Start     Ordered   01/13/22 0000  Diet - low sodium heart healthy        01/13/22 1028           {Diet_Plan:26776} DISCHARGE MEDICATION: Allergies as of 01/13/2022       Reactions   Codeine Other (See Comments)   Tremors   Sulfamethoxazole-trimethoprim Rash        Medication List     TAKE these medications    albuterol 108 (90 Base) MCG/ACT inhaler Commonly known as: VENTOLIN HFA Inhale 2 puffs into the lungs every 6 (six) hours as needed for wheezing or shortness of breath.   levofloxacin 750 MG tablet Commonly known as: LEVAQUIN Take 1 tablet (750 mg total) by mouth daily for 7 days. Start taking on: January 14, 2022   metoprolol tartrate 25 MG tablet Commonly known as: LOPRESSOR Take 1 tablet (25 mg total) by mouth 2 (two) times daily.   nicotine 21 mg/24hr patch Commonly known as: NICODERM CQ - dosed in mg/24 hours Place 1 patch (21 mg total) onto the skin daily.   oxybutynin 10 MG 24 hr tablet Commonly known as: DITROPAN-XL Take 1 tablet (10 mg total) by mouth daily.   oxyCODONE 5 MG immediate release tablet Commonly known as: Oxy IR/ROXICODONE Take 1 tablet (5 mg total) by mouth every 4 (four) hours as needed for moderate pain.   polyethylene glycol 17 g packet Commonly known as: MiraLax Take 17 g by mouth daily.        Discharge Exam: Filed Weights   01/10/22 1957  Weight: 89.8 kg   ***  Condition at discharge: {DC Condition:26389}  The results of significant diagnostics from this hospitalization (including imaging, microbiology, ancillary and laboratory) are listed below for reference.   Imaging Studies: CT Renal Stone Study  Result Date: 01/10/2022 CLINICAL DATA:  Right-sided flank pain; recent stent right kidney with stones and infection in August EXAM: CT ABDOMEN AND PELVIS WITHOUT CONTRAST TECHNIQUE: Multidetector CT imaging of the abdomen and pelvis was performed following the standard  protocol without IV contrast. RADIATION DOSE REDUCTION: This exam was performed according to the departmental dose-optimization program which includes automated exposure control, adjustment of the mA and/or kV according to patient size and/or use of iterative reconstruction technique. COMPARISON:  CT 12/15/2021 FINDINGS: Lower chest: No acute abnormality. Hepatobiliary: No focal liver abnormality is seen. No gallstones, gallbladder wall thickening, or biliary dilatation. Pancreas: Unremarkable. No pancreatic ductal dilatation or surrounding inflammatory changes. Spleen: Normal in size without focal abnormality. Adrenals/Urinary Tract: Stable bilateral low-attenuation lesions in the adrenal glands compatible with benign adenomas. No follow-up is required. The left kidney is not visualized. Interval placement of a right ureteral stent. Decreased right hydronephrosis. Multiple right renal stones with the largest measuring 1.0 cm. Marked perinephric stranding. The complex cystic appearing area in the mid right kidney has decreased in size now measuring approximately 1.7 x 2.5 cm. Unremarkable bladder. Stomach/Bowel: Stomach is within normal limits. Appendix appears normal. No evidence of bowel wall thickening, distention, or inflammatory changes. Vascular/Lymphatic: Aortic atherosclerosis. No enlarged abdominal or pelvic lymph nodes. Reproductive: Prostate is unremarkable.  Large bilateral hydroceles. Other: No free intraperitoneal air. Musculoskeletal: No acute or significant osseous findings. IMPRESSION: 1. Interval placement of a right ureteral stent with decreased right hydronephrosis. 2. Numerous nonobstructing right renal calculi measuring up to 1.0 cm. 3. Perinephric inflammatory fat stranding about the right kidney consistent with pyelonephritis. 4. Cystic lesion within the mid right kidney has decreased in size since 12/15/2021 though is poorly evaluated without IV contrast. This may represent improving abscess  however underlying neoplastic process is not excluded. Recommend continued attention on follow-up. 5. Absent left kidney. 6. Bilateral hydroceles. 7.  Aortic Atherosclerosis (ICD10-I70.0). Electronically Signed   By: Placido Sou M.D.   On: 01/10/2022 21:48   DG Chest Port 1 View  Result Date: 12/17/2021 CLINICAL DATA:  Hypoxia EXAM: PORTABLE CHEST 1 VIEW COMPARISON:  09/30/2020 FINDINGS: Cardiac size is within normal limits. There is peribronchial thickening. There is no focal consolidation. There is minimal blunting of left lateral CP angle. There is no pneumothorax. There is a metallic density overlying the left side of mediastinum, most likely residual from previous trauma. IMPRESSION: There is prominence of interstitial markings in perihilar regions suggesting bronchitis or mild interstitial edema. There is no focal pulmonary consolidation. Minimal left pleural effusion. Electronically Signed   By: Elmer Picker M.D.   On: 12/17/2021 18:18   DG Abd 1 View  Result Date: 12/16/2021 CLINICAL DATA:  Acute kidney injury EXAM: ABDOMEN - 1 VIEW COMPARISON:  CT abdomen pelvis 12/15/2020 3 FINDINGS: Right ureteral stent in good position. 8 mm calculus overlying the right renal pelvis unchanged from the prior CT. No left renal calculi. Nonobstructive bowel gas pattern. No dilated bowel loops. No acute skeletal abnormality. IMPRESSION: Right ureteral stent in good position. No change in 8 mm calculus overlying the right renal pelvis. Electronically Signed   By: Franchot Gallo M.D.   On: 12/16/2021 12:56   US RENAL  Result Date: 12/16/2021 CLINICAL DATA:  AKI EXAM: RENAL / URINARY TRACT ULTRASOUND COMPLETE COMPARISON:  December 15, 2021, September 30, 2020 FINDINGS: Right Kidney: Renal measurements: 16.5 x 9.1 x 9.3 = volume: 731 mL. Increased echogenicity of the right kidney. Mild hydronephrosis, with a focally dilated interpolar calyx. Shadowing stone seen at the level of the right UPJ, measuring up to 11  mm. Focal echogenic mass-like area in the interpolar right kidney measuring 7.1 x 6.8 x 5.2 cm , corresponding with the complex cystic area seen on prior CT. Left Kidney: The left kidney is not visualized. Bladder: Decompressed with a Foley in place. Other: None. IMPRESSION: 1. Obstructive 11 mm stone in the right UPJ with resulting mild hydronephrosis. 2. Focal echogenic mass-like area in the interpolar right kidney measuring up to 7.1 cm and corresponding with the complex cystic area seen on CT. Differential considerations include focal pyelonephritis/abscess or a neoplastic process. After course of acute illness, consider renal MRI with contrast for further assessment. Electronically Signed   By: Beryle Flock M.D.   On: 12/16/2021 10:32   DG OR UROLOGY CYSTO IMAGE ([facility] ONLY)  Result Date: 12/15/2021 There is no interpretation for this exam.  This order is for images obtained during a surgical procedure.  Please See "Surgeries" Tab for more information regarding the procedure.   CT Renal Stone Study  Result Date: 12/15/2021 CLINICAL DATA:  Right-sided abdominal pain and nausea. EXAM: CT ABDOMEN AND PELVIS WITHOUT CONTRAST TECHNIQUE: Multidetector CT imaging of the abdomen and pelvis was performed following the standard protocol without IV contrast. RADIATION DOSE REDUCTION: This exam was performed according to the departmental dose-optimization program which includes automated exposure control, adjustment of the mA and/or kV according to patient size and/or use of iterative reconstruction technique. COMPARISON:  None Available. FINDINGS: Lower chest: No acute abnormality. Hepatobiliary: No focal liver abnormality is seen. No gallstones, gallbladder wall thickening, or biliary dilatation. Pancreas: Unremarkable. No pancreatic ductal dilatation or surrounding inflammatory changes. Spleen: Normal in size without focal abnormality. Adrenals/Urinary Tract: A 2.7 cm x 2.4 cm low-attenuation (approximately  -12.08 Hounsfield units) right adrenal mass is seen. A similar appearing 1.2 cm x 1.3 cm low-attenuation (approximately 5.93 Hounsfield units) left adrenal mass is also noted. The left kidney is not visualized. The right kidney is enlarged. A 4.2 cm x 3.2 cm x 5.2 cm complex cystic appearing area is seen within the mid right kidney. Numerous 1 mm and 2 mm nonobstructing renal calculi are seen throughout the right kidney. A 9 mm obstructing renal calculus is seen at the right UPJ, with moderate severity right-sided hydronephrosis and moderate to marked severity right-sided perinephric inflammatory fat stranding. The urinary bladder is partially contracted and subsequently limited in evaluation. Mild diffuse urinary bladder wall thickening is seen. Stomach/Bowel: Stomach is within normal limits. Appendix appears normal. No evidence of bowel wall thickening, distention, or inflammatory changes. Vascular/Lymphatic: Aortic atherosclerosis. No enlarged abdominal or pelvic lymph nodes. Reproductive: A mild-to-moderate amount of parenchymal calcification is seen within a mildly enlarged prostate gland. Large bilateral hydroceles are seen. Other: No abdominal wall hernia or abnormality. No abdominopelvic ascites. Musculoskeletal: Multilevel degenerative changes seen throughout the lumbar spine. This is most prominent at the level of L5-S1. IMPRESSION: 1. 9 mm obstructing renal calculus at the right UPJ. 2. Numerous 1 mm and 2 mm nonobstructing right renal calculi. 3. Perinephric inflammatory fat stranding which may be, in part, secondary to partial obstruction of the right kidney. Sequelae associated with superimposed acute pyelonephritis cannot be excluded. 4. Large complex cystic lesion within the mid right kidney. Further evaluation with MRI (  without and with IV contrast) is recommended to exclude the presence of an underlying neoplastic process. 5. Absent left kidney. 6. Bilateral low-attenuation adrenal masses likely  consistent with adrenal adenomas. 7. Large bilateral hydroceles. 8. Aortic atherosclerosis. Aortic Atherosclerosis (ICD10-I70.0). Electronically Signed   By: Virgina Norfolk M.D.   On: 12/15/2021 02:58    Microbiology: Results for orders placed or performed during the hospital encounter of 01/10/22  Urine Culture     Status: Abnormal   Collection Time: 01/10/22  8:02 PM   Specimen: Urine, Random  Result Value Ref Range Status   Specimen Description   Final    URINE, RANDOM Performed at [facility], [address] [place], [place] [address]    Special Requests   Final    NONE Performed at [facility], [address] [place], [place] [address]    Culture >=100,000 COLONIES/mL ESCHERICHIA COLI (A)  Final   Report Status 01/13/2022 FINAL  Final   Organism ID, Bacteria ESCHERICHIA COLI (A)  Final      Susceptibility   Escherichia coli - MIC*    AMPICILLIN <=2 SENSITIVE Sensitive     CEFAZOLIN <=4 SENSITIVE Sensitive     CEFEPIME <=0.12 SENSITIVE Sensitive     CEFTRIAXONE <=0.25 SENSITIVE Sensitive     CIPROFLOXACIN <=0.25 SENSITIVE Sensitive     GENTAMICIN <=1 SENSITIVE Sensitive     IMIPENEM <=0.25 SENSITIVE Sensitive     NITROFURANTOIN <=16 SENSITIVE Sensitive     TRIMETH/SULFA <=20 SENSITIVE Sensitive     AMPICILLIN/SULBACTAM <=2 SENSITIVE Sensitive     PIP/TAZO <=4 SENSITIVE Sensitive     * >=100,000 COLONIES/mL ESCHERICHIA COLI  Blood culture (routine x 2)     Status: Abnormal (Preliminary result)   Collection Time: 01/10/22  9:51 PM   Specimen: BLOOD  Result Value Ref Range Status   Specimen Description   Final    BLOOD RIGHT ASSIST CONTROL Performed at [facility], [address], [place], [place] [address]    Special Requests   Final    BOTTLES DRAWN AEROBIC AND ANAEROBIC Blood Culture results may not be optimal due to an excessive volume of blood received in culture bottles Performed at [facility], [address]  [place], [place] [address]    Culture  Setup Time   Final    GRAM NEGATIVE RODS ANAEROBIC BOTTLE ONLY CRITICAL RESULT CALLED TO, READ BACK BY AND VERIFIED WITH: JASON ROBBINS @ 6503 01/12/22 LFD    Culture (A)  Final    ESCHERICHIA COLI SUSCEPTIBILITIES TO FOLLOW Performed at [facility], [place] [address], [place], [place] [address]    Report Status PENDING  Incomplete  Blood Culture ID Panel (Reflexed)     Status: Abnormal   Collection Time: 01/10/22  9:51 PM  Result Value Ref Range Status   Enterococcus faecalis NOT DETECTED NOT DETECTED Final   Enterococcus Faecium NOT DETECTED NOT DETECTED Final   Listeria monocytogenes NOT DETECTED NOT DETECTED Final   Staphylococcus species NOT DETECTED NOT DETECTED Final   Staphylococcus aureus (BCID) NOT DETECTED NOT DETECTED Final   Staphylococcus epidermidis NOT DETECTED NOT DETECTED Final   Staphylococcus lugdunensis NOT DETECTED NOT DETECTED Final   Streptococcus species NOT DETECTED NOT DETECTED Final   Streptococcus agalactiae NOT DETECTED NOT DETECTED Final   Streptococcus pneumoniae NOT DETECTED NOT DETECTED Final   Streptococcus pyogenes NOT DETECTED NOT DETECTED Final   A.calcoaceticus-baumannii NOT DETECTED NOT DETECTED Final   Bacteroides fragilis NOT DETECTED  NOT DETECTED Final   Enterobacterales DETECTED (A) NOT DETECTED Final    Comment: Enterobacterales represent a large order of gram negative bacteria, not a single organism. CRITICAL RESULT CALLED TO, READ BACK BY AND VERIFIED WITH: JASON ROBBINS @ 0645 01/12/22 LFD    Enterobacter cloacae complex NOT DETECTED NOT DETECTED Final   Escherichia coli DETECTED (A) NOT DETECTED Final    Comment: CRITICAL RESULT CALLED TO, READ BACK BY AND VERIFIED WITH: JASON ROBBINS@ 0645 01/12/22 LFD    Klebsiella aerogenes NOT DETECTED NOT DETECTED Final   Klebsiella oxytoca NOT DETECTED NOT DETECTED Final   Klebsiella pneumoniae NOT DETECTED NOT DETECTED Final   Proteus species NOT  DETECTED NOT DETECTED Final   Salmonella species NOT DETECTED NOT DETECTED Final   Serratia marcescens NOT DETECTED NOT DETECTED Final   Haemophilus influenzae NOT DETECTED NOT DETECTED Final   Neisseria meningitidis NOT DETECTED NOT DETECTED Final   Pseudomonas aeruginosa NOT DETECTED NOT DETECTED Final   Stenotrophomonas maltophilia NOT DETECTED NOT DETECTED Final   Candida albicans NOT DETECTED NOT DETECTED Final   Candida auris NOT DETECTED NOT DETECTED Final   Candida glabrata NOT DETECTED NOT DETECTED Final   Candida krusei NOT DETECTED NOT DETECTED Final   Candida parapsilosis NOT DETECTED NOT DETECTED Final   Candida tropicalis NOT DETECTED NOT DETECTED Final   Cryptococcus neoformans/gattii NOT DETECTED NOT DETECTED Final   CTX-M ESBL NOT DETECTED NOT DETECTED Final   Carbapenem resistance IMP NOT DETECTED NOT DETECTED Final   Carbapenem resistance KPC NOT DETECTED NOT DETECTED Final   Carbapenem resistance NDM NOT DETECTED NOT DETECTED Final   Carbapenem resist OXA 48 LIKE NOT DETECTED NOT DETECTED Final   Carbapenem resistance VIM NOT DETECTED NOT DETECTED Final    Comment: Performed at [facility], [address] [place], [place] [address]  Blood culture (routine x 2)     Status: None (Preliminary result)   Collection Time: 01/10/22 10:04 PM   Specimen: BLOOD  Result Value Ref Range Status   Specimen Description BLOOD RIGHT WRIST  Final   Special Requests   Final    BOTTLES DRAWN AEROBIC AND ANAEROBIC Blood Culture results may not be optimal due to an excessive volume of blood received in culture bottles   Culture  Setup Time PENDING  Incomplete   Culture   Final    NO GROWTH 3 DAYS Performed at [facility], [address] [place], [place] [address]    Report Status PENDING  Incomplete    Labs: CBC: Recent Labs  Lab 01/10/22 2004 01/11/22 4782 01/12/22 0823 01/13/22 0614  WBC 24.3* 24.2* 12.7* 9.8  NEUTROABS  --   --  9.7* 7.0  HGB  14.0 12.0* 11.0* 11.4*  HCT 43.0 35.5* 33.3* 34.5*  MCV 92.1 91.3 92.0 91.8  PLT 282 231 201 956   Basic Metabolic Panel: Recent Labs  Lab 01/10/22 2004 01/11/22 0608 01/12/22 0823 01/13/22 0614  NA 132* 132* 136 136  K 4.4 3.8 3.8 3.8  CL 101 105 108 107  CO2 18* 19* 22 22  GLUCOSE 129* 124* 112* 112*  BUN 28* 28* 21 17  CREATININE 1.60* 1.43* 1.39* 1.30*  CALCIUM 9.4 8.5* 8.4* 8.5*   Liver Function Tests: No results for input(s): "AST", "ALT", "ALKPHOS", "BILITOT", "PROT", "ALBUMIN" in the last 168 hours. CBG: No results for input(s): "GLUCAP" in the last 168 hours.  Discharge time spent: {LESS THAN/GREATER OZHY:86578} 30 minutes.  Signed: Jennye Boroughs, MD [facility]  01/13/2022 

## 2022-01-13 NOTE — Progress Notes (Signed)
1052 D/c paperwork reviewed with pt. All questions and concerns answered. IV removed. 1st dose of PO Levaquin given to pt. No narc given to pt within last 24hrs , pt will d/c and drive self home.

## 2022-01-14 LAB — CULTURE, BLOOD (ROUTINE X 2)

## 2022-01-15 LAB — CULTURE, BLOOD (ROUTINE X 2): Culture: NO GROWTH

## 2022-01-20 ENCOUNTER — Encounter
Admission: RE | Admit: 2022-01-20 | Discharge: 2022-01-20 | Disposition: A | Discharge status: Home / Self Care | Payer: MEDICAID | Source: Ambulatory Visit | Attending: Urology | Admitting: Urology

## 2022-01-20 ENCOUNTER — Telehealth: Payer: Self-pay

## 2022-01-20 HISTORY — DX: Bacteremia: R78.81

## 2022-01-20 HISTORY — DX: Bronchitis, not specified as acute or chronic: J40

## 2022-01-20 HISTORY — DX: Gram-negative sepsis, unspecified: A41.50

## 2022-01-20 HISTORY — DX: Renal agenesis, unilateral: Q60.0

## 2022-01-20 HISTORY — DX: Gram-negative sepsis, unspecified: N39.0

## 2022-01-20 HISTORY — DX: Tobacco use: Z72.0

## 2022-01-20 HISTORY — DX: Unspecified Escherichia coli (E. coli) as the cause of diseases classified elsewhere: B96.20

## 2022-01-20 MED ORDER — LEVOFLOXACIN 750 MG PO TABS: 750.0000 mg | ORAL_TABLET | Freq: Every day | ORAL | 0 refills | Status: AC

## 2022-01-20 NOTE — Patient Instructions (Signed)
Your procedure is scheduled on:01-25-22 Tuesday Report to the Registration Desk on the 1st floor of the [facility].Then proceed to the 2nd floor Surgery Desk To find out your arrival time, please call [phone] between 1PM - 3PM on:01-24-22 Monday If your arrival time is 6:00 am, do not arrive prior to that time as the [facility] entrance doors do not open until 6:00 am.  REMEMBER: Instructions that are not followed completely may result in serious medical risk, up to and including death; or upon the discretion of your surgeon and anesthesiologist your surgery may need to be rescheduled.  Do not eat food OR drink any liquids after midnight the night before surgery.  No gum chewing, lozengers or hard candies.  Do NOT take any medication the day of surgery  Use your Albuterol Inhaler the day of surgery and bring your Inhaler to the hospital  One week prior to surgery: Stop Anti-inflammatories (NSAIDS) such as Advil, Aleve, Ibuprofen, Motrin, Naproxen, Naprosyn and Aspirin based products such as Excedrin, Goodys Powder, BC Powder.You may however, continue to take Tylenol if needed for pain up until the day of surgery.  Stop ANY OVER THE COUNTER supplements/vitamins NOW (01-20-22) until after surgery.   No Alcohol for 24 hours before or after surgery.  No Smoking including e-cigarettes for 24 hours prior to surgery.  No chewable tobacco products for at least 6 hours prior to surgery.  No nicotine patches on the day of surgery.  Do not use any "recreational" drugs for at least a week prior to your surgery.  Please be advised that the combination of cocaine and anesthesia may have negative outcomes, up to and including death. If you test positive for cocaine, your surgery will be cancelled.  On the morning of surgery brush your teeth with toothpaste and water, you may rinse your mouth with mouthwash if you wish. Do not swallow any toothpaste or mouthwash.  Do not wear jewelry,  make-up, hairpins, clips or nail polish.  Do not wear lotions, powders, or perfumes.   Do not shave body from the neck down 48 hours prior to surgery just in case you cut yourself which could leave a site for infection.  Also, freshly shaved skin may become irritated if using the CHG soap.  Contact lenses, hearing aids and dentures may not be worn into surgery.  Do not bring valuables to the hospital. [facility] is not responsible for any missing/lost belongings or valuables.   Notify your doctor if there is any change in your medical condition (cold, fever, infection).  Wear comfortable clothing (specific to your surgery type) to the hospital.  After surgery, you can help prevent lung complications by doing breathing exercises.  Take deep breaths and cough every 1-2 hours. Your doctor may order a device called an Incentive Spirometer to help you take deep breaths. When coughing or sneezing, hold a pillow firmly against your incision with both hands. This is called "splinting." Doing this helps protect your incision. It also decreases belly discomfort.  If you are being admitted to the hospital overnight, leave your suitcase in the car. After surgery it may be brought to your room.  If you are being discharged the day of surgery, you will not be allowed to drive home. You will need a responsible adult (18 years or older) to drive you home and stay with you that night.   If you are taking public transportation, you will need to have a responsible adult (18 years or  older) with you. Please confirm with your physician that it is acceptable to use public transportation.   Please call the [facility] Dept. at [phone] if you have any questions about these instructions.  Surgery Visitation Policy:  Patients undergoing a surgery or procedure may have two family members or support persons with them as long as the person is not COVID-19 positive or experiencing its symptoms.

## 2022-01-20 NOTE — Telephone Encounter (Signed)
Spoke with Mr. Toste, he finished his Levaquin yesterday. After speaking with Dr. Bernardo Heater, he informed that he would like for the patient to continue the Levaquin until his surgery date. I have sent in additional medication to the [facility] on [address] and patient is aware.

## 2022-01-25 ENCOUNTER — Other Ambulatory Visit: Payer: Self-pay

## 2022-01-25 DIAGNOSIS — N201 Calculus of ureter: Secondary | ICD-10-CM

## 2022-01-25 MED ORDER — AMOXICILLIN 875 MG PO TABS: 875.0000 mg | ORAL_TABLET | Freq: Two times a day (BID) | ORAL | 0 refills | Status: DC

## 2022-01-25 NOTE — Progress Notes (Signed)
Send in rx amoxicillin 875 mg daily until surgery and repeat urine cx through PAT on Wednesday or Thursday-- Per Dr. Bernardo Heater, MD  Spoke with pt. Sent in medication and have placed orders for patient to have ua and cx at [facility] lab. Patient aware and will have done tomorrow.

## 2022-01-26 ENCOUNTER — Other Ambulatory Visit
Admission: RE | Admit: 2022-01-26 | Discharge: 2022-01-26 | Disposition: A | Discharge status: Home / Self Care | Payer: Self-pay | Attending: Urology | Admitting: Urology

## 2022-01-26 DIAGNOSIS — N201 Calculus of ureter: Secondary | ICD-10-CM | POA: Insufficient documentation

## 2022-01-26 LAB — URINALYSIS, COMPLETE (UACMP) WITH MICROSCOPIC
Bacteria, UA: NONE SEEN
Bilirubin Urine: NEGATIVE
Glucose, UA: NEGATIVE mg/dL
Ketones, ur: NEGATIVE mg/dL
Nitrite: NEGATIVE
Protein, ur: NEGATIVE mg/dL
Specific Gravity, Urine: 1.006 (ref 1.005–1.030)
pH: 6 (ref 5.0–8.0)

## 2022-01-27 LAB — URINE CULTURE: Culture: NO GROWTH

## 2022-01-28 ENCOUNTER — Other Ambulatory Visit: Payer: Self-pay

## 2022-02-01 ENCOUNTER — Encounter: Payer: Self-pay | Admitting: Urology

## 2022-02-01 ENCOUNTER — Encounter
Admission: RE | Disposition: A | Discharge status: Home / Self Care | Payer: Self-pay | Source: Home / Self Care | Attending: Urology

## 2022-02-01 ENCOUNTER — Ambulatory Visit
Admission: RE | Admit: 2022-02-01 | Discharge: 2022-02-01 | Disposition: A | Discharge status: Home / Self Care | Payer: Self-pay | Attending: Urology | Admitting: Urology

## 2022-02-01 ENCOUNTER — Ambulatory Visit: Payer: Self-pay | Admitting: General Practice

## 2022-02-01 ENCOUNTER — Ambulatory Visit: Payer: Self-pay

## 2022-02-01 ENCOUNTER — Ambulatory Visit: Payer: Self-pay | Admitting: Urgent Care

## 2022-02-01 DIAGNOSIS — N200 Calculus of kidney: Secondary | ICD-10-CM

## 2022-02-01 DIAGNOSIS — I10 Essential (primary) hypertension: Secondary | ICD-10-CM | POA: Insufficient documentation

## 2022-02-01 DIAGNOSIS — J449 Chronic obstructive pulmonary disease, unspecified: Secondary | ICD-10-CM | POA: Insufficient documentation

## 2022-02-01 DIAGNOSIS — N201 Calculus of ureter: Secondary | ICD-10-CM

## 2022-02-01 DIAGNOSIS — N132 Hydronephrosis with renal and ureteral calculous obstruction: Secondary | ICD-10-CM | POA: Insufficient documentation

## 2022-02-01 DIAGNOSIS — F17210 Nicotine dependence, cigarettes, uncomplicated: Secondary | ICD-10-CM | POA: Insufficient documentation

## 2022-02-01 HISTORY — PX: CYSTOSCOPY/URETEROSCOPY/HOLMIUM LASER/STENT PLACEMENT: SHX6546

## 2022-02-01 SURGERY — CYSTOSCOPY/URETEROSCOPY/HOLMIUM LASER/STENT PLACEMENT
Anesthesia: General | Laterality: Right

## 2022-02-01 MED ORDER — OXYCODONE HCL 5 MG/5ML PO SOLN: 5.0000 mg | Freq: Once | ORAL | Status: DC | PRN

## 2022-02-01 MED ORDER — DEXMEDETOMIDINE HCL IN NACL 200 MCG/50ML IV SOLN
INTRAVENOUS | Status: DC | PRN
  Administered 2022-02-01 (×3): 4 ug via INTRAVENOUS

## 2022-02-01 MED ORDER — PROPOFOL 10 MG/ML IV BOLUS
INTRAVENOUS | Status: DC | PRN
  Administered 2022-02-01: 150 mg via INTRAVENOUS
  Administered 2022-02-01: 10 mg via INTRAVENOUS

## 2022-02-01 MED ORDER — OXYBUTYNIN CHLORIDE 5 MG PO TABS: ORAL_TABLET | ORAL | 0 refills | Status: AC

## 2022-02-01 MED ORDER — ACETAMINOPHEN 10 MG/ML IV SOLN
INTRAVENOUS | Status: DC | PRN
  Administered 2022-02-01: 1000 mg via INTRAVENOUS

## 2022-02-01 MED ORDER — FENTANYL CITRATE (PF) 100 MCG/2ML IJ SOLN: 25.0000 ug | INTRAMUSCULAR | Status: DC | PRN

## 2022-02-01 MED ORDER — TAMSULOSIN HCL 0.4 MG PO CAPS: 0.4000 mg | ORAL_CAPSULE | Freq: Every day | ORAL | 0 refills | Status: DC

## 2022-02-01 MED ORDER — OXYCODONE HCL 5 MG PO TABS: 5.0000 mg | ORAL_TABLET | Freq: Once | ORAL | Status: DC | PRN

## 2022-02-01 MED ORDER — CEFAZOLIN SODIUM-DEXTROSE 2-4 GM/100ML-% IV SOLN
2.0000 g | INTRAVENOUS | Status: AC
  Administered 2022-02-01: 2 g via INTRAVENOUS

## 2022-02-01 MED ORDER — LACTATED RINGERS IV SOLN: INTRAVENOUS | Status: DC

## 2022-02-01 MED ORDER — DEXAMETHASONE SODIUM PHOSPHATE 10 MG/ML IJ SOLN
INTRAMUSCULAR | Status: DC | PRN
  Administered 2022-02-01: 10 mg via INTRAVENOUS

## 2022-02-01 MED ORDER — FENTANYL CITRATE (PF) 100 MCG/2ML IJ SOLN
INTRAMUSCULAR | Status: AC
  Filled 2022-02-01: qty 2

## 2022-02-01 MED ORDER — PROPOFOL 10 MG/ML IV BOLUS
INTRAVENOUS | Status: AC
  Filled 2022-02-01: qty 20

## 2022-02-01 MED ORDER — FENTANYL CITRATE (PF) 100 MCG/2ML IJ SOLN
INTRAMUSCULAR | Status: DC | PRN
  Administered 2022-02-01 (×2): 50 ug via INTRAVENOUS

## 2022-02-01 MED ORDER — LIDOCAINE HCL (CARDIAC) PF 100 MG/5ML IV SOSY
PREFILLED_SYRINGE | INTRAVENOUS | Status: DC | PRN
  Administered 2022-02-01: 15 mg via INTRAVENOUS

## 2022-02-01 MED ORDER — SUGAMMADEX SODIUM 200 MG/2ML IV SOLN
INTRAVENOUS | Status: DC | PRN
  Administered 2022-02-01: 200 mg via INTRAVENOUS

## 2022-02-01 MED ORDER — IOHEXOL 180 MG/ML  SOLN
INTRAMUSCULAR | Status: DC | PRN
  Administered 2022-02-01: 10 mL

## 2022-02-01 MED ORDER — CEFAZOLIN SODIUM-DEXTROSE 2-4 GM/100ML-% IV SOLN
INTRAVENOUS | Status: AC
  Filled 2022-02-01: qty 100

## 2022-02-01 MED ORDER — ORAL CARE MOUTH RINSE: 15.0000 mL | Freq: Once | OROMUCOSAL | Status: AC

## 2022-02-01 MED ORDER — SUCCINYLCHOLINE CHLORIDE 200 MG/10ML IV SOSY
PREFILLED_SYRINGE | INTRAVENOUS | Status: DC | PRN
  Administered 2022-02-01: 100 mg via INTRAVENOUS

## 2022-02-01 MED ORDER — MIDAZOLAM HCL 2 MG/2ML IJ SOLN
INTRAMUSCULAR | Status: AC
  Filled 2022-02-01: qty 2

## 2022-02-01 MED ORDER — CHLORHEXIDINE GLUCONATE 0.12 % MT SOLN
15.0000 mL | Freq: Once | OROMUCOSAL | Status: AC
  Administered 2022-02-01: 15 mL via OROMUCOSAL

## 2022-02-01 MED ORDER — MIDAZOLAM HCL 2 MG/2ML IJ SOLN
INTRAMUSCULAR | Status: DC | PRN
  Administered 2022-02-01: 2 mg via INTRAVENOUS

## 2022-02-01 MED ORDER — FAMOTIDINE 20 MG PO TABS
ORAL_TABLET | ORAL | Status: AC
  Filled 2022-02-01: qty 1

## 2022-02-01 MED ORDER — SODIUM CHLORIDE 0.9 % IR SOLN
Status: DC | PRN
  Administered 2022-02-01: 3000 mL

## 2022-02-01 MED ORDER — ACETAMINOPHEN 10 MG/ML IV SOLN
INTRAVENOUS | Status: AC
  Filled 2022-02-01: qty 100

## 2022-02-01 MED ORDER — FAMOTIDINE 20 MG PO TABS
20.0000 mg | ORAL_TABLET | Freq: Once | ORAL | Status: AC
  Administered 2022-02-01: 20 mg via ORAL

## 2022-02-01 MED ORDER — CHLORHEXIDINE GLUCONATE 0.12 % MT SOLN
OROMUCOSAL | Status: AC
  Filled 2022-02-01: qty 15

## 2022-02-01 MED ORDER — ONDANSETRON HCL 4 MG/2ML IJ SOLN
INTRAMUSCULAR | Status: DC | PRN
  Administered 2022-02-01: 4 mg via INTRAVENOUS

## 2022-02-01 MED ORDER — ROCURONIUM BROMIDE 100 MG/10ML IV SOLN
INTRAVENOUS | Status: DC | PRN
  Administered 2022-02-01: 10 mg via INTRAVENOUS
  Administered 2022-02-01: 40 mg via INTRAVENOUS

## 2022-02-01 SURGICAL SUPPLY — 21 items
BAG DRAIN SIEMENS DORNER NS (MISCELLANEOUS) ×1 IMPLANT
BAG DRN NS LF (MISCELLANEOUS) ×1
BASKET LASER NITINOL 1.9FR (BASKET) IMPLANT
BRUSH SCRUB EZ 1% IODOPHOR (MISCELLANEOUS) ×1 IMPLANT
BSKT STON RTRVL 120 1.9FR (BASKET) ×1
DRAPE UTILITY 15X26 TOWEL STRL (DRAPES) ×1 IMPLANT
FIBER LASER MOSES 200 DFL (Laser) IMPLANT
GLOVE SURG UNDER POLY LF SZ7.5 (GLOVE) ×1 IMPLANT
GOWN STRL REUS W/ TWL LRG LVL3 (GOWN DISPOSABLE) ×1 IMPLANT
GOWN STRL REUS W/ TWL XL LVL3 (GOWN DISPOSABLE) ×1 IMPLANT
GOWN STRL REUS W/TWL LRG LVL3 (GOWN DISPOSABLE) ×1
GOWN STRL REUS W/TWL XL LVL3 (GOWN DISPOSABLE) ×1
GUIDEWIRE STR DUAL SENSOR (WIRE) ×1 IMPLANT
IV NS IRRIG 3000ML ARTHROMATIC (IV SOLUTION) ×1 IMPLANT
KIT TURNOVER CYSTO (KITS) ×1 IMPLANT
PACK CYSTO AR (MISCELLANEOUS) ×1 IMPLANT
SET CYSTO W/LG BORE CLAMP LF (SET/KITS/TRAYS/PACK) ×1 IMPLANT
STENT URET 6FRX24 CONTOUR (STENTS) IMPLANT
TRAP FLUID SMOKE EVACUATOR (MISCELLANEOUS) ×1 IMPLANT
VALVE UROSEAL ADJ ENDO (VALVE) IMPLANT
WATER STERILE IRR 500ML POUR (IV SOLUTION) ×1 IMPLANT

## 2022-02-01 NOTE — Discharge Instructions (Addendum)
DISCHARGE INSTRUCTIONS FOR KIDNEY STONE/URETERAL STENT   MEDICATIONS:  1. Resume all your other meds from home.  2.  AZO (over-the-counter) can help with the burning/stinging when you urinate. 3.  Tamsulosin and oxybutynin are for bladder irritation/spasm, Rxs were sent to your pharmacy.  ACTIVITY:  1. May resume regular activities in 24 hours. 2. No driving while on narcotic pain medications  3. Drink plenty of water  4. Continue to walk at home - you can still get blood clots when you are at home, so keep active, but don't over do it.  5. May return to work/school tomorrow or when you feel ready    SIGNS/SYMPTOMS TO CALL:  Common postoperative symptoms include urinary frequency, urgency, bladder spasm and blood in the urine  Please call us if you have a fever greater than 101.5, uncontrolled nausea/vomiting, uncontrolled pain, dizziness, unable to urinate, excessively bloody urine, chest pain, shortness of breath, leg swelling, leg pain, or any other concerns or questions.   You can reach [place] at [phone].   FOLLOW-UP:  1. You will be contacted by [place] for a follow-up appointment for stent removal in 7-10 days.  AMBULATORY SURGERY  DISCHARGE INSTRUCTIONS   The drugs that you were given will stay in your system until tomorrow so for the next 24 hours you should not:  Drive an automobile Make any legal decisions Drink any alcoholic beverage   You may resume regular meals tomorrow.  Today it is better to start with liquids and gradually work up to solid foods.  You may eat anything you prefer, but it is better to start with liquids, then soup and crackers, and gradually work up to solid foods.   Please notify your doctor immediately if you have any unusual bleeding, trouble breathing, redness and pain at the surgery site, drainage, fever, or pain not relieved by medication.    Additional Instructions:    Please contact your physician with  any problems or Same Day Surgery at [phone], Monday through Friday 6 am to 4 pm, or [place] at [facility] number at [phone].

## 2022-02-01 NOTE — Anesthesia Postprocedure Evaluation (Signed)
Anesthesia Post Note  Patient: Willie White  Procedure(s) Performed: CYSTOSCOPY/URETEROSCOPY/HOLMIUM LASER/STENT EXCHANGE (Right)  Patient location during evaluation: PACU Anesthesia Type: General Level of consciousness: awake and alert Pain management: pain level controlled Vital Signs Assessment: post-procedure vital signs reviewed and stable Respiratory status: spontaneous breathing, nonlabored ventilation, respiratory function stable and patient connected to nasal cannula oxygen Cardiovascular status: blood pressure returned to baseline and stable Postop Assessment: no apparent nausea or vomiting Anesthetic complications: no   No notable events documented.   Last Vitals:  Vitals:   02/01/22 1400 02/01/22 1408  BP: 124/87 115/87  Pulse: (!) 57 (!) 56  Resp: 15 16  Temp: (!) 36.1 C (!) 36.2 C  SpO2: 98% 98%    Last Pain:  Vitals:   02/01/22 1408  TempSrc: Temporal  PainSc: 0-No pain                 Dimas Millin

## 2022-02-01 NOTE — Transfer of Care (Signed)
Immediate Anesthesia Transfer of Care Note  Patient: Willie White  Procedure(s) Performed: CYSTOSCOPY/URETEROSCOPY/HOLMIUM LASER/STENT EXCHANGE (Right)  Patient Location: PACU  Anesthesia Type:General  Level of Consciousness: awake  Airway & Oxygen Therapy: Patient Spontanous Breathing  Post-op Assessment: Report given to RN and Post -op Vital signs reviewed and stable  Post vital signs: Reviewed and stable  Last Vitals:  Vitals Value Taken Time  BP 121/69 02/01/22 1347  Temp 36.1 C 02/01/22 1347  Pulse 57 02/01/22 1347  Resp 14 02/01/22 1348  SpO2 96 % 02/01/22 1347  Vitals shown include unvalidated device data.  Last Pain:  Vitals:   02/01/22 1044  TempSrc: Temporal  PainSc: 0-No pain      Patients Stated Pain Goal: 0 (00/92/33 0076)  Complications: No notable events documented.

## 2022-02-01 NOTE — Anesthesia Preprocedure Evaluation (Signed)
Anesthesia Evaluation  Patient identified by MRN, date of birth, ID band Patient awake    Reviewed: Allergy & Precautions, NPO status , Patient's Chart, lab work & pertinent test results  History of Anesthesia Complications Negative for: history of anesthetic complications  Airway Mallampati: IV  TM Distance: >3 FB Neck ROM: Full    Dental  (+) Poor Dentition, Dental Advidsory Given   Pulmonary neg pulmonary ROS, COPD, Current Smoker and Patient abstained from smoking.,    Pulmonary exam normal breath sounds clear to auscultation       Cardiovascular hypertension, negative cardio ROS Normal cardiovascular exam Rhythm:Regular Rate:Normal     Neuro/Psych negative neurological ROS  negative psych ROS   GI/Hepatic negative GI ROS, Neg liver ROS,   Endo/Other  negative endocrine ROS  Renal/GU Renal disease (solitary kidney)     Musculoskeletal   Abdominal   Peds  Hematology negative hematology ROS (+)   Anesthesia Other Findings Past Medical History: No date: Bronchitis No date: Congenital single kidney     Comment:  Right only No date: COPD (chronic obstructive pulmonary disease) (HCC) No date: E coli bacteremia No date: Hypertension No date: Sepsis due to gram-negative UTI (HCC) No date: Tobacco use  Past Surgical History: 12/15/2021: CYSTOSCOPY WITH URETEROSCOPY AND STENT PLACEMENT; Right     Comment:  Procedure: CYSTOSCOPY WITH RIGHT URETERAL STENT               PLACEMENT;  Surgeon: Abbie Sons, MD;  Location:               [facility] ORS;  Service: Urology;  Laterality: Right; No date: HAND SURGERY; Left No date: INNER EAR SURGERY; Left  BMI    Body Mass Index: 29.80 kg/m      Reproductive/Obstetrics negative OB ROS                             Anesthesia Physical  Anesthesia Plan  ASA: 3  Anesthesia Plan: General   Post-op Pain Management:    Induction: Intravenous  and Rapid sequence  PONV Risk Score and Plan: 1 and Ondansetron, Dexamethasone and Treatment may vary due to age or medical condition  Airway Management Planned: Oral ETT  Additional Equipment:   Intra-op Plan:   Post-operative Plan: Extubation in OR  Informed Consent: I have reviewed the patients History and Physical, chart, labs and discussed the procedure including the risks, benefits and alternatives for the proposed anesthesia with the patient or authorized representative who has indicated his/her understanding and acceptance.     Dental advisory given  Plan Discussed with: CRNA  Anesthesia Plan Comments: (Patient consented for risks of anesthesia including but not limited to:  - adverse reactions to medications - damage to eyes, teeth, lips or other oral mucosa - nerve damage due to positioning  - sore throat or hoarseness - damage to heart, brain, nerves, lungs, other parts of body or loss of life  Informed patient about role of CRNA in peri- and intra-operative care.  Patient voiced understanding.)        Anesthesia Quick Evaluation

## 2022-02-01 NOTE — Interval H&P Note (Signed)
History and Physical Interval Note:  61 y.o. male with solitary right kidney status post right ureteral stent placement for an obstructing UPJ stone with sepsis.  Readmitted with pyelonephritis.  Urine culture repeated last week with no growth.  He presents today for definitive stone treatment.  CV: RRR Lungs: Clear  02/01/2022 11:57 AM  Willie White  has presented today for surgery, with the diagnosis of Right Ureteropelvic Junction Stone.  The various methods of treatment have been discussed with the patient and family. After consideration of risks, benefits and other options for treatment, the patient has consented to  Procedure(s): CYSTOSCOPY/URETEROSCOPY/HOLMIUM LASER/STENT EXCHANGE (Right) as a surgical intervention.  The patient's history has been reviewed, patient examined, no change in status, stable for surgery.  I have reviewed the patient's chart and labs.  Questions were answered to the patient's satisfaction.     [place]

## 2022-02-01 NOTE — Anesthesia Procedure Notes (Signed)
Procedure Name: Intubation Date/Time: 02/01/2022 12:17 PM  Performed by: Biagio Borg, CRNAPre-anesthesia Checklist: Patient identified, Emergency Drugs available, Suction available and Patient being monitored Patient Re-evaluated:Patient Re-evaluated prior to induction Oxygen Delivery Method: Circle system utilized Preoxygenation: Pre-oxygenation with 100% oxygen Induction Type: IV induction Ventilation: Two handed mask ventilation required Laryngoscope Size: McGraph and 4 Grade View: Grade I Tube type: Oral Tube size: 7.0 mm Number of attempts: 1 Airway Equipment and Method: Stylet and Oral airway Placement Confirmation: ETT inserted through vocal cords under direct vision, positive ETCO2 and breath sounds checked- equal and bilateral Tube secured with: Tape Dental Injury: Teeth and Oropharynx as per pre-operative assessment

## 2022-02-02 ENCOUNTER — Encounter: Payer: Self-pay | Admitting: Urology

## 2022-02-02 NOTE — Op Note (Signed)
Preoperative diagnosis: Right nephrolithiasis   Postoperative diagnosis: Right nephrolithiasis  Procedure:  Cystoscopy Right ureteroscopy and stone removal Ureteroscopic laser lithotripsy Right ureteral stent placement (11F/24 cm) Right retrograde pyelography with interpretation  Surgeon: Nicki Reaper C. Devita Nies, M.D.  Anesthesia: General  Complications: None  Intraoperative findings:  Cystoscopy-urethra normal in caliber without stricture.  Prostate with mild lateral lobe enlargement.  Moderate bladder trabeculation present.  Inflammatory changes right hemitrigone secondary to indwelling stent.  No solid or papillary lesions identified.  A left UO was not identified Ureteroscopy-no ureteral abnormalities or calculi identified.  Calculus identified in an upper pole calyx Right retrograde pyelography post procedure showed no filling defects, stone fragments or contrast extravasation  EBL: Minimal  Specimens: Calculus fragments for analysis   Indication: Willie White is a 61 y.o. with a solitary right kidney status post right ureteral stent placement for an obstructing 8 mm right UPJ calculus with sepsis/pyonephrosis.  He presents today for definitive stone management. After reviewing the management options for treatment, the patient elected to proceed with the above surgical procedure(s). We have discussed the potential benefits and risks of the procedure, side effects of the proposed treatment, the likelihood of the patient achieving the goals of the procedure, and any potential problems that might occur during the procedure or recuperation. Informed consent has been obtained.  Description of procedure:  The patient was taken to the operating room and general anesthesia was induced.  The patient was placed in the dorsal lithotomy position, prepped and draped in the usual sterile fashion, and preoperative antibiotics were administered. A preoperative time-out was performed.   A 21 French  cystoscope was lubricated inserted per urethra and advanced proximally under direct vision with findings as described above.  The right ureteral stent was grasped with endoscopic forceps and brought out to the urethral meatus.  A 0.038 Sensor wire was placed through the stent and advanced into the renal pelvis under fluoroscopic guidance followed by stent removal.  A single channel digital flexible ureteroscope was inserted per urethra and advanced into the bladder.  The ureteroscope was end started into the right UO alongside the guidewire and advanced into the renal pelvis with findings as described above.  A 200 m Moses holmium laser fiber was placed through the ureteroscope and the stone was dusted at an initial setting of 0.3J/80 Hz and due to stone hardness was increased to 0.3 J/120 Hz.  As the calculus decreased in size power was decreased to 0.3J/40 Hz.  Once down to 2 small fragments these were placed in a 1.9 [place] Escape basket and then dropped into the bladder.  The ureteroscope was repassed and retrograde pyelogram was performed.  All calyces were examined under fluoroscopic guidance and only minute dust/particles were identified.  The ureteroscope was removed and a 11F/24 cm Contour ureteral stent was placed under fluoroscopic guidance.  The wire was then removed with an adequate stent curl noted in an upper pole calyx as well as in the bladder.  The bladder was then emptied and the procedure ended.  The patient appeared to tolerate the procedure well and without complications.  After anesthetic reversal the patient was transported to the PACU in stable condition.   Plan: Follow-up cystoscopy with stent removal 7-10 days with KUB prior   John Giovanni, MD

## 2022-02-04 ENCOUNTER — Other Ambulatory Visit: Payer: Self-pay

## 2022-02-04 DIAGNOSIS — N201 Calculus of ureter: Secondary | ICD-10-CM

## 2022-02-07 LAB — CALCULI, WITH PHOTOGRAPH (CLINICAL LAB)
Calcium Oxalate Monohydrate: 10 %
Hydroxyapatite: 90 %
Weight Calculi: 32 mg

## 2022-02-10 ENCOUNTER — Ambulatory Visit
Admission: RE | Admit: 2022-02-10 | Discharge: 2022-02-10 | Disposition: A | Discharge status: Home / Self Care | Payer: Self-pay | Attending: Urology | Admitting: Urology

## 2022-02-10 ENCOUNTER — Encounter: Payer: Self-pay | Admitting: Urology

## 2022-02-10 ENCOUNTER — Ambulatory Visit
Admission: RE | Admit: 2022-02-10 | Discharge: 2022-02-10 | Disposition: A | Discharge status: Home / Self Care | Payer: Self-pay | Source: Ambulatory Visit | Attending: Urology | Admitting: Urology

## 2022-02-10 ENCOUNTER — Ambulatory Visit (INDEPENDENT_AMBULATORY_CARE_PROVIDER_SITE_OTHER): Payer: Self-pay | Admitting: Urology

## 2022-02-10 VITALS — BP 113/72 | HR 75 | Ht 68.0 in | Wt 191.0 lb

## 2022-02-10 DIAGNOSIS — Z466 Encounter for fitting and adjustment of urinary device: Secondary | ICD-10-CM

## 2022-02-10 DIAGNOSIS — N201 Calculus of ureter: Secondary | ICD-10-CM

## 2022-02-10 DIAGNOSIS — Z87442 Personal history of urinary calculi: Secondary | ICD-10-CM

## 2022-02-10 NOTE — Progress Notes (Signed)
Indications: Patient is 61 y.o. with a solitary kidney who is s/p ureteroscopic removal of an 8 mm right renal calculus 02/01/2022.  The patient is presenting today for stent removal.  He had no postoperative problems and moderate stent symptoms  KUB performed today was reviewed.  The stent is in good position.  There is calcification lateral to the stent and the true bony pelvis which appear to be phleboliths on prior CT.  Fine calculus fragments more calyces  Procedure:  Flexible Cystoscopy with stent removal (38182)  Timeout was performed and the correct patient, procedure and participants were identified.    Description:  The patient was prepped and draped in the usual sterile fashion. Flexible cystosopy was performed.  The stent was visualized, grasped, and removed intact without difficulty. The patient tolerated the procedure well.  A single dose of oral antibiotics was given.  Complications:  None  Plan:  Instructed to call for fever/flank pain post stent removal 3 month follow-up with KUB Recommend increasing water intake to keep urine output >2 L/day and he was provided literature on stone prevention Stone analysis CaOxMono/hydroxyapatite 10/90 Based on stone composition recommend metabolic stone evaluation   John Giovanni, MD

## 2022-02-23 ENCOUNTER — Ambulatory Visit: Payer: Self-pay | Admitting: Urology

## 2022-05-07 ENCOUNTER — Other Ambulatory Visit: Payer: Self-pay

## 2022-05-07 ENCOUNTER — Encounter: Payer: Self-pay | Admitting: Emergency Medicine

## 2022-05-07 ENCOUNTER — Emergency Department
Admission: EM | Admit: 2022-05-07 | Discharge: 2022-05-07 | Disposition: A | Discharge status: Home / Self Care | Payer: Self-pay | Attending: Student in an Organized Health Care Education/Training Program | Admitting: Student in an Organized Health Care Education/Training Program

## 2022-05-07 DIAGNOSIS — K047 Periapical abscess without sinus: Secondary | ICD-10-CM | POA: Insufficient documentation

## 2022-05-07 MED ORDER — IBUPROFEN 600 MG PO TABS
600.0000 mg | ORAL_TABLET | Freq: Once | ORAL | Status: AC
  Administered 2022-05-07: 600 mg via ORAL
  Filled 2022-05-07: qty 1

## 2022-05-07 MED ORDER — AMOXICILLIN 500 MG PO CAPS: 500.0000 mg | ORAL_CAPSULE | Freq: Three times a day (TID) | ORAL | 0 refills | Status: AC

## 2022-05-07 MED ORDER — OXYCODONE-ACETAMINOPHEN 5-325 MG PO TABS: 1.0000 | ORAL_TABLET | ORAL | 0 refills | Status: AC | PRN

## 2022-05-07 MED ORDER — LIDOCAINE HCL (PF) 1 % IJ SOLN
5.0000 mL | Freq: Once | INTRAMUSCULAR | Status: AC
  Administered 2022-05-07: 5 mL via INTRADERMAL
  Filled 2022-05-07: qty 5

## 2022-05-07 MED ORDER — AMOXICILLIN 500 MG PO CAPS
500.0000 mg | ORAL_CAPSULE | Freq: Once | ORAL | Status: AC
  Administered 2022-05-07: 500 mg via ORAL
  Filled 2022-05-07: qty 1

## 2022-05-07 NOTE — ED Triage Notes (Signed)
Pt to ER states he got up this AM and note a small amount of swelling to left upper face.  States he took a nap and when he got up the swelling had increased significantly.  Pt states he has some bad teeth, but he is not having any significant pain at this time.

## 2022-05-07 NOTE — ED Provider Notes (Signed)
[facility] Provider Note    Event Date/Time   First MD Initiated Contact with Patient 05/07/22 1451     (approximate)   History   Facial Swelling   HPI  [name] is a 62 y.o. male presents to the ER for evaluation of left facial swelling and discomfort.  Patient does have carious dentition.  His pain is mild.  No fevers.  No trouble swallowing.  No shortness of breath.     Physical Exam   Triage Vital Signs: ED Triage Vitals  Enc Vitals Group     BP 05/07/22 1432 (!) 155/105     Pulse Rate 05/07/22 1432 87     Resp 05/07/22 1432 18     Temp 05/07/22 1432 98.6 F (37 C)     Temp src --      SpO2 05/07/22 1432 93 %     Weight 05/07/22 1433 198 lb (89.8 kg)     Height 05/07/22 1433 '5\' 8"'$  (3.762 m)     Head Circumference --      Peak Flow --      Pain Score 05/07/22 1433 7     Pain Loc --      Pain Edu? --      Excl. in [place]? --     Most recent vital signs: Vitals:   05/07/22 1432  BP: (!) 155/105  Pulse: 87  Resp: 18  Temp: 98.6 F (37 C)  SpO2: 93%     Constitutional: Alert  Eyes: Conjunctivae are normal.  Head: Atraumatic. Nose: No congestion/rhinnorhea. Mouth/Throat: Mucous membranes are moist.  Carious dentition with evidence of periapical abscess of left upper jawline.  No trismus. Neck: Painless ROM.  Cardiovascular:   Good peripheral circulation. Respiratory: Normal respiratory effort.  No retractions.  Gastrointestinal: Soft and nontender.  Musculoskeletal:  no deformity Neurologic:  MAE spontaneously. No gross focal neurologic deficits are appreciated.  Skin:  Skin is warm, dry and intact. No rash noted. Psychiatric: Mood and affect are normal. Speech and behavior are normal.    ED Results / Procedures / Treatments   Labs (all labs ordered are listed, but only abnormal results are displayed) Labs Reviewed - No data to display   EKG     RADIOLOGY    PROCEDURES:  Critical Care performed:    Marland KitchenMarland KitchenIncision and Drainage  Date/Time: 05/07/2022 3:36 PM  Performed by: Merlyn Lot, MD Authorized by: Merlyn Lot, MD   Consent:    Consent obtained:  Verbal   Consent given by:  Patient   Risks discussed:  Bleeding, infection, incomplete drainage and pain   Alternatives discussed:  Alternative treatment, delayed treatment and observation Location:    Type:  Abscess   Location:  Mouth   Mouth location:  Alveolar process Anesthesia:    Anesthesia method:  Local infiltration   Local anesthetic:  Lidocaine 1% w/o epi Procedure type:    Complexity:  Simple Procedure details:    Incision depth:  Submucosal   Drainage amount:  Moderate   Packing materials:  None Post-procedure details:    Procedure completion:  Tolerated    MEDICATIONS ORDERED IN ED: Medications  lidocaine (PF) (XYLOCAINE) 1 % injection 5 mL (has no administration in time range)  amoxicillin (AMOXIL) capsule 500 mg (has no administration in time range)  ibuprofen (ADVIL) tablet 600 mg (has no administration in time range)     IMPRESSION / MDM / ASSESSMENT AND PLAN / ED COURSE  I reviewed  the triage vital signs and the nursing notes.                              Differential diagnosis includes, but is not limited to, abscess, cellulitis, mass   Patient with evidence of left dental abscess was I&D without complication.  Patient placed on antibiotics.  Stable appropriate for outpatient follow-up       FINAL CLINICAL IMPRESSION(S) / ED DIAGNOSES   Final diagnoses:  Dental abscess     Rx / DC Orders   ED Discharge Orders          Ordered    amoxicillin (AMOXIL) 500 MG capsule  3 times daily        05/07/22 1535    oxyCODONE-acetaminophen (PERCOCET) 5-325 MG tablet  Every 4 hours PRN        05/07/22 1535             Note:  This document was prepared using Dragon voice recognition software and may include unintentional dictation errors.    Merlyn Lot, MD 05/07/22  1536

## 2022-05-13 ENCOUNTER — Ambulatory Visit: Payer: Self-pay | Admitting: Urology

## 2022-05-13 ENCOUNTER — Encounter: Payer: Self-pay | Admitting: Urology

## 2022-08-13 ENCOUNTER — Other Ambulatory Visit: Payer: Self-pay

## 2022-08-13 ENCOUNTER — Emergency Department: Payer: Self-pay

## 2022-08-13 ENCOUNTER — Emergency Department
Admission: EM | Admit: 2022-08-13 | Discharge: 2022-08-13 | Disposition: A | Discharge status: Home / Self Care | Payer: Self-pay | Attending: Emergency Medicine | Admitting: Emergency Medicine

## 2022-08-13 DIAGNOSIS — D72829 Elevated white blood cell count, unspecified: Secondary | ICD-10-CM | POA: Insufficient documentation

## 2022-08-13 DIAGNOSIS — K047 Periapical abscess without sinus: Secondary | ICD-10-CM | POA: Insufficient documentation

## 2022-08-13 LAB — CBC WITH DIFFERENTIAL/PLATELET
Abs Immature Granulocytes: 0.04 10*3/uL (ref 0.00–0.07)
Basophils Absolute: 0.1 10*3/uL (ref 0.0–0.1)
Basophils Relative: 1 %
Eosinophils Absolute: 0.2 10*3/uL (ref 0.0–0.5)
Eosinophils Relative: 2 %
HCT: 50.4 % (ref 39.0–52.0)
Hemoglobin: 16.5 g/dL (ref 13.0–17.0)
Immature Granulocytes: 0 %
Lymphocytes Relative: 18 %
Lymphs Abs: 2 10*3/uL (ref 0.7–4.0)
MCH: 31.8 pg (ref 26.0–34.0)
MCHC: 32.7 g/dL (ref 30.0–36.0)
MCV: 97.1 fL (ref 80.0–100.0)
Monocytes Absolute: 1.4 10*3/uL — ABNORMAL HIGH (ref 0.1–1.0)
Monocytes Relative: 13 %
Neutro Abs: 7.3 10*3/uL (ref 1.7–7.7)
Neutrophils Relative %: 66 %
Platelets: 251 10*3/uL (ref 150–400)
RBC: 5.19 MIL/uL (ref 4.22–5.81)
RDW: 13.4 % (ref 11.5–15.5)
WBC: 11 10*3/uL — ABNORMAL HIGH (ref 4.0–10.5)
nRBC: 0 % (ref 0.0–0.2)

## 2022-08-13 LAB — BASIC METABOLIC PANEL
Anion gap: 10 (ref 5–15)
BUN: 22 mg/dL (ref 8–23)
CO2: 24 mmol/L (ref 22–32)
Calcium: 9.5 mg/dL (ref 8.9–10.3)
Chloride: 102 mmol/L (ref 98–111)
Creatinine, Ser: 1.23 mg/dL (ref 0.61–1.24)
GFR, Estimated: >60 mL/min (ref >60)
Glucose, Bld: 128 mg/dL — ABNORMAL HIGH (ref 70–99)
Potassium: 4.4 mmol/L (ref 3.5–5.1)
Sodium: 136 mmol/L (ref 135–145)

## 2022-08-13 MED ORDER — CLINDAMYCIN HCL 150 MG PO CAPS
450.0000 mg | ORAL_CAPSULE | Freq: Once | ORAL | Status: AC
  Administered 2022-08-13: 450 mg via ORAL
  Filled 2022-08-13: qty 3

## 2022-08-13 MED ORDER — CLINDAMYCIN HCL 150 MG PO CAPS: 450.0000 mg | ORAL_CAPSULE | Freq: Three times a day (TID) | ORAL | 0 refills | Status: AC

## 2022-08-13 MED ORDER — SODIUM CHLORIDE 0.9 % IV SOLN
3.0000 g | Freq: Once | INTRAVENOUS | Status: AC
  Administered 2022-08-13: 3 g via INTRAVENOUS
  Filled 2022-08-13: qty 8

## 2022-08-13 MED ORDER — IOHEXOL 300 MG/ML  SOLN
100.0000 mL | Freq: Once | INTRAMUSCULAR | Status: AC | PRN
  Administered 2022-08-13: 100 mL via INTRAVENOUS

## 2022-08-13 NOTE — ED Provider Notes (Signed)
[facility] Provider Note    Event Date/Time   First MD Initiated Contact with Patient 08/13/22 1008     (approximate)   History   Facial Swelling   HPI  Willie White is a 62 y.o. male with history of dental abscess who comes in with right facial swelling.  He reports swelling that started this morning.  He reports pain along the nasal passage and down into his upper teeth on the right side.  He denies feeling any fluctuation in his mouth.  He states that he had similar swelling previously on the left side that he had had a I&D done of that tooth but this time he does not feel an obvious abscess.  He denies any fevers.  Here to denies any changes in medications he is actually not been taking any medications recently no ACE inhibitor's.  Denies any fevers.  He denies any vision changes.  I reviewed patient's admission summary from 02/01/2022   Physical Exam   Triage Vital Signs: ED Triage Vitals  Enc Vitals Group     BP 08/13/22 1007 (!) 153/96     Pulse Rate 08/13/22 1004 84     Resp 08/13/22 1004 18     Temp 08/13/22 1004 98.5 F (36.9 C)     Temp Source 08/13/22 1004 Oral     SpO2 08/13/22 1004 97 %     Weight 08/13/22 1004 210 lb (95.3 kg)     Height 08/13/22 1004 5\' 9"  (1.753 m)     Head Circumference --      Peak Flow --      Pain Score 08/13/22 1006 8     Pain Loc --      Pain Edu? --      Excl. in [place]? --     Most recent vital signs: Vitals:   08/13/22 1004 08/13/22 1007  BP:  (!) 153/96  Pulse: 84   Resp: 18   Temp: 98.5 F (36.9 C)   SpO2: 97%      General: Awake, no distress.  CV:  Good peripheral perfusion.  Resp:  Normal effort.  Abd:  No distention.  Other:  Patient has swelling underneath the right eye into the right zygomatic process.  He is got very poor dentition throughout but no periapical abscess was felt with palpation.  Extraocular movements are intact without any swelling on the top of the eye, no conjunctival  injection, patient reports normal vision.  He has full range of motion of neck denies any pain in the bottom of his mouth or in his neck or throat.   ED Results / Procedures / Treatments   Labs (all labs ordered are listed, but only abnormal results are displayed) Labs Reviewed  CBC WITH DIFFERENTIAL/PLATELET  BASIC METABOLIC PANEL      RADIOLOGY I have reviewed the CT head personally and interpreted subperiosteal abscess   PROCEDURES:  Critical Care performed: No  Procedures   MEDICATIONS ORDERED IN ED: Medications  clindamycin (CLEOCIN) capsule 450 mg (has no administration in time range)  Ampicillin-Sulbactam (UNASYN) 3 g in sodium chloride 0.9 % 100 mL IVPB (0 g Intravenous Stopped 08/13/22 1218)  iohexol (OMNIPAQUE) 300 MG/ML solution 100 mL (100 mLs Intravenous Contrast Given 08/13/22 1130)     IMPRESSION / MDM / ASSESSMENT AND PLAN / ED COURSE  I reviewed the triage vital signs and the nursing notes.   Patient's presentation is most consistent with acute presentation with potential  threat to life or bodily function.   Patient comes in with some swelling underneath the right eye.  I do not feel an obvious abscess therefore we will proceed with CT imaging to ensure there is no abscess being missed that would need ENT drainage.  Although I suspect this is more likely a cellulitis from periapical disease.  Will give a dose of Unasyn and get CT imaging.  Patient's not septic at this time no medications to suggest angioedema.  IMPRESSION: Subperiosteal abscess along the right paramidline maxilla with adjacent large periapical lucencies involving the right medial and lateral incisors. Adjacent cellulitis/phlegmon.  CBC shows elevated white count but patient is not septic appearing does not meet sepsis criteria.  BMP is normal   Discussed with ENT Dr Willeen Cass-- recommend transfer to [facility] with for oral surgeon vs admission here for iV antibioitics but its nothing that he can  intervene on and needs tooth pulled.   I discussed with patient about transfer to [facility] however I cannot guarantee that patient will have his tooth pulled but we discussed that he could potentially need at least IV antibiotics and potential drainage of abscess after the tooth is pulled.  Patient states that he needs to go home and feed his animals.  He does not want to be admitted to the hospital or transfer to [facility].  He understands the risk for death, permanent disability and understands that the safest thing would do to proceed with this however at this time he would like to trial oral antibiotics will place on clindamycin per Dr. Talmage Nap recommendation.  And he understands that if his symptoms are worsening or changing he can always return to a hospital for further workup and potential intervention.  However at this time he understands that he is leaving against my advice and he is going to trial oral antibiotics at home.     FINAL CLINICAL IMPRESSION(S) / ED DIAGNOSES   Final diagnoses:  Dental abscess     Rx / DC Orders   ED Discharge Orders          Ordered    clindamycin (CLEOCIN) 150 MG capsule  3 times daily        08/13/22 1314             Note:  This document was prepared using Dragon voice recognition software and may include unintentional dictation errors.   Concha Se, MD 08/13/22 1315

## 2022-08-13 NOTE — ED Triage Notes (Signed)
Pt presents via POV c/o unilateral right sided facial swelling. Reports first noticed last night. Reports some pain.

## 2022-08-13 NOTE — ED Notes (Signed)
Pt transported to CT ?

## 2022-08-13 NOTE — ED Notes (Signed)
Pt ambulatory at discharge. Pt verbalized understanding of discharge teaching, follow-up care, and prescription. Pt denies having additional questions.

## 2022-08-13 NOTE — Discharge Instructions (Addendum)
We recommend admission for IV antibiotics or transfer to [facility] for consultation of oral maxillary surgery for possible tooth removal and drainage of abscess you have declined either of these.  We will start you on some oral antibiotics in the meantime if symptoms are worsening develop fevers worsening swelling or any other concerns and please return to the hospital for further workup and intervention.  Even if things do resolve with the antibiotics you should still follow-up with a dentist for this tooth to be removed  IMPRESSION: Subperiosteal abscess along the right paramidline maxilla with adjacent large periapical lucencies involving the right medial and lateral incisors. Adjacent cellulitis/phlegmon.

## 2022-11-17 IMAGING — US US RENAL
2 series · 13 of 25 positions shown · non-contrast
Comparison: Chest CT including upper abdomen September 30, 2020

CLINICAL DATA: Questionable right renal lesion on recent CT

EXAM:
RENAL / URINARY TRACT ULTRASOUND COMPLETE

[Series 1: us renal · 12 of 141 slices shown]
[im 1/141]
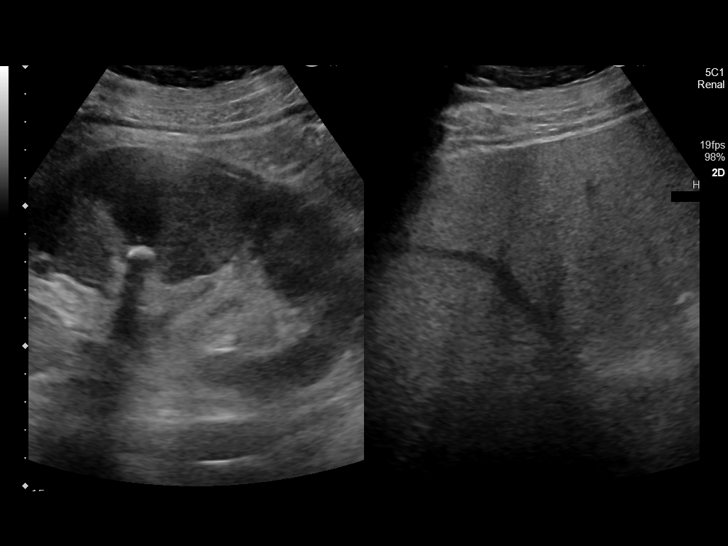
[im 13/141]
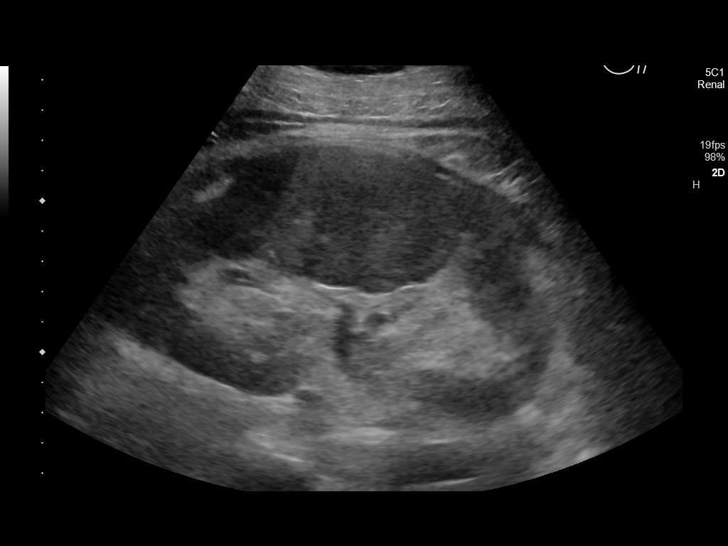
[im 25/141]
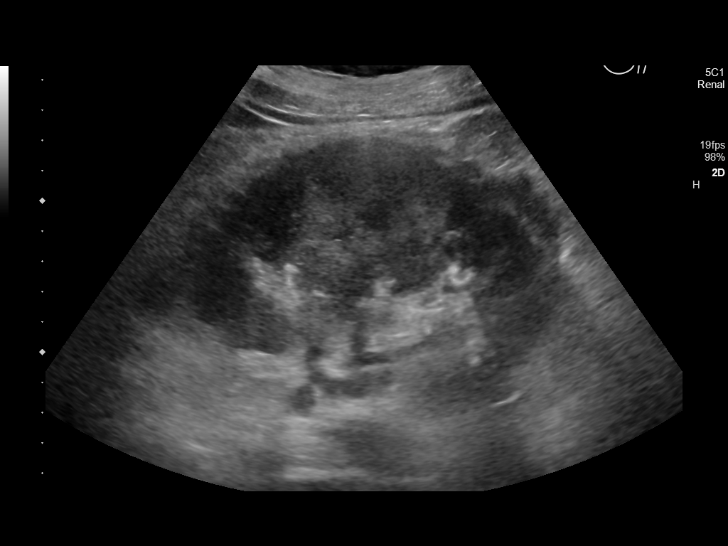
[im 37/141]
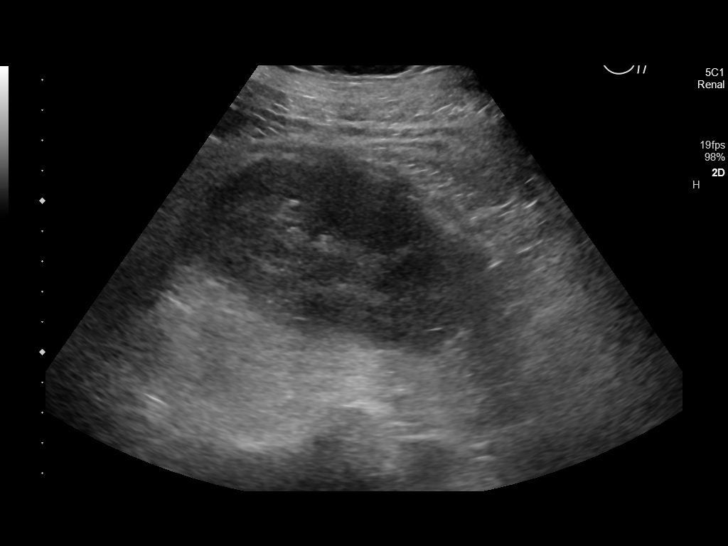
[im 49/141]
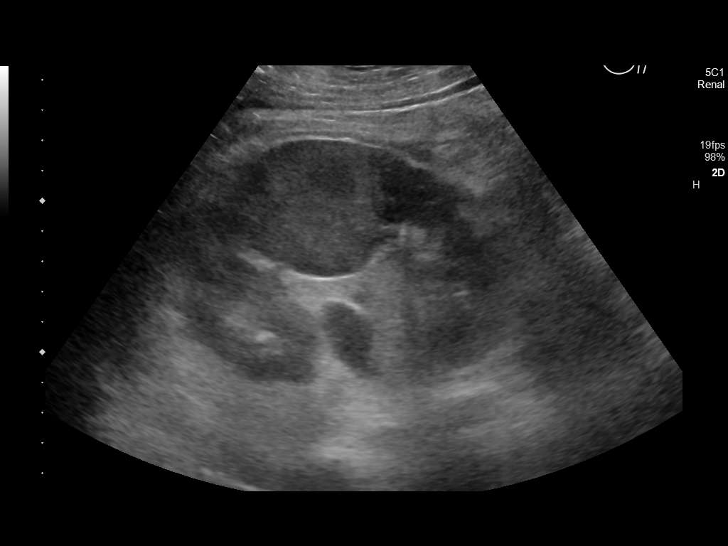
[im 61/141]
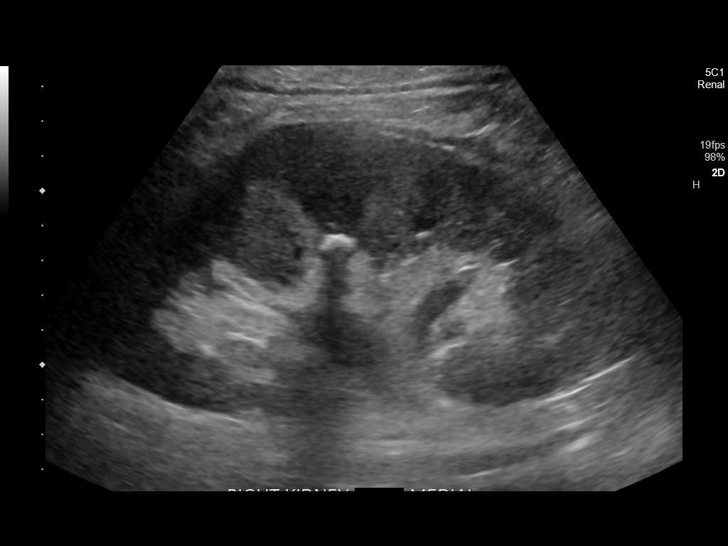
[im 74/141]
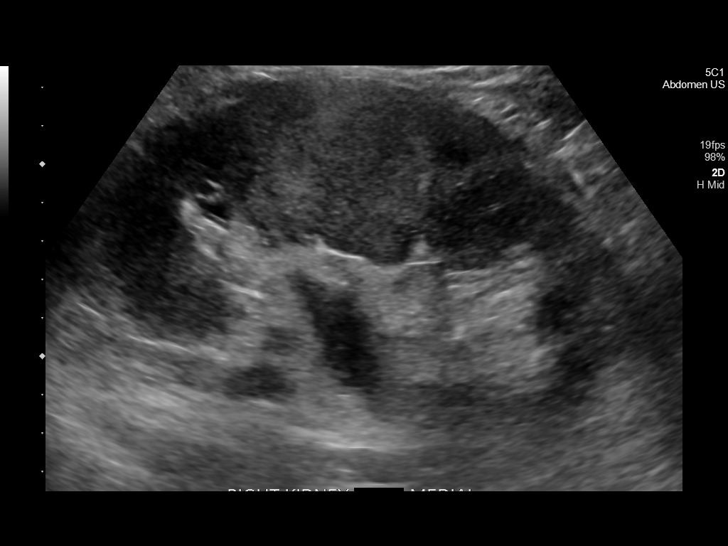
[im 86/141]
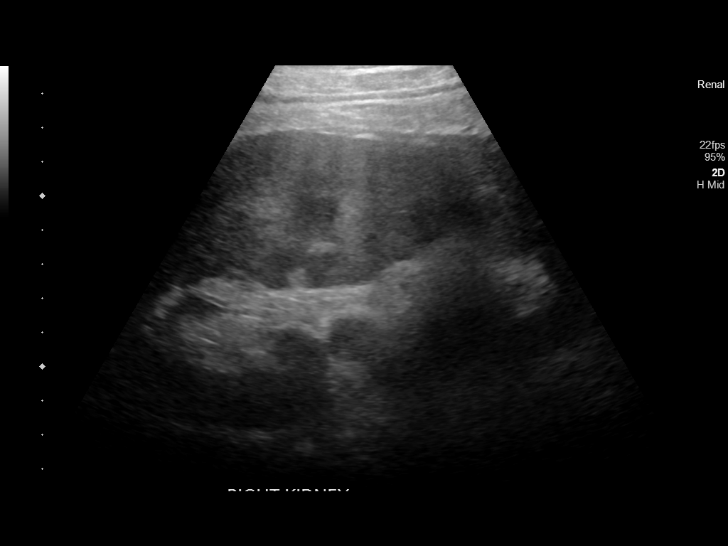
[im 98/141]
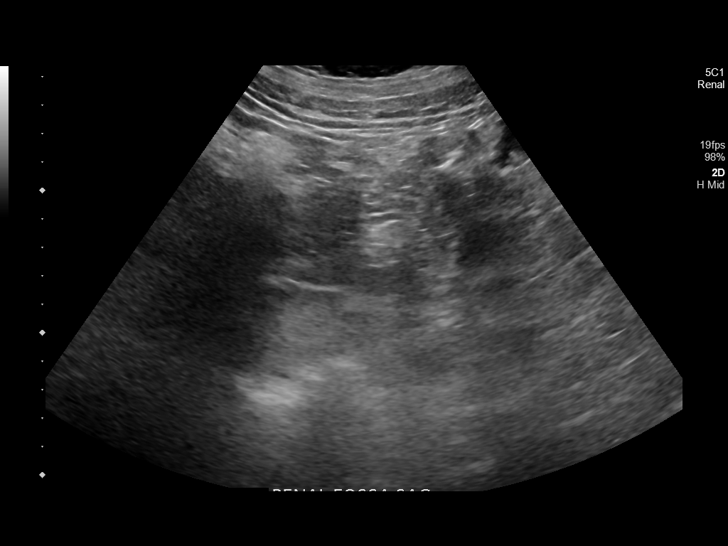
[im 110/141]
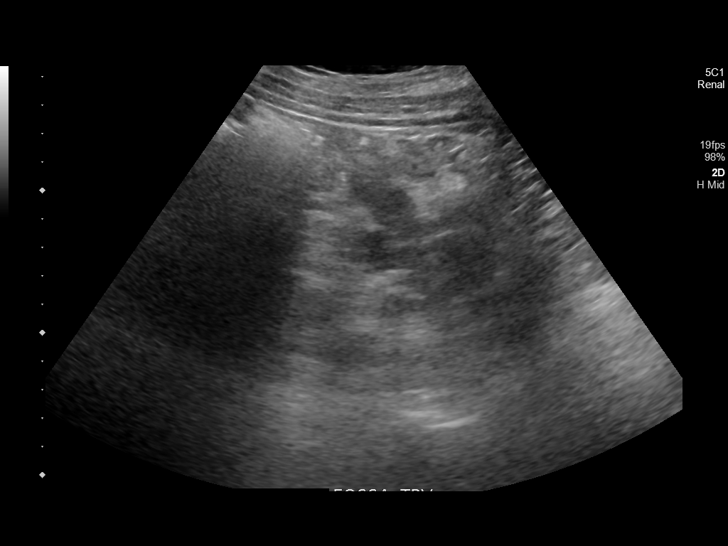
[im 122/141]
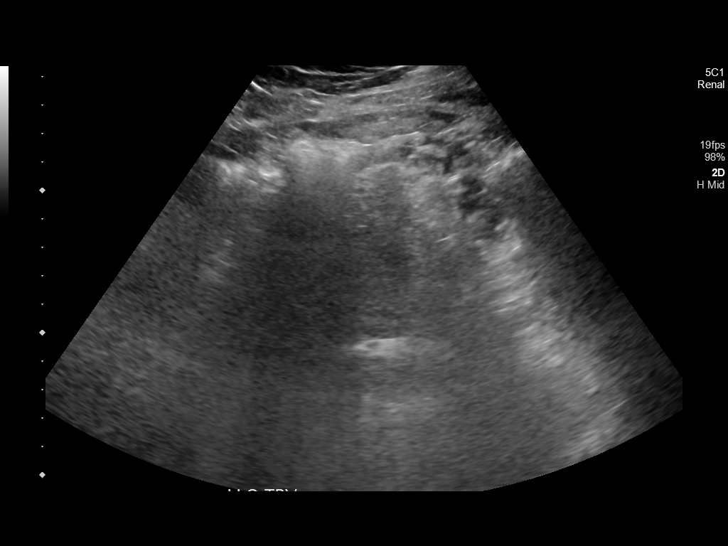
[im 134/141]
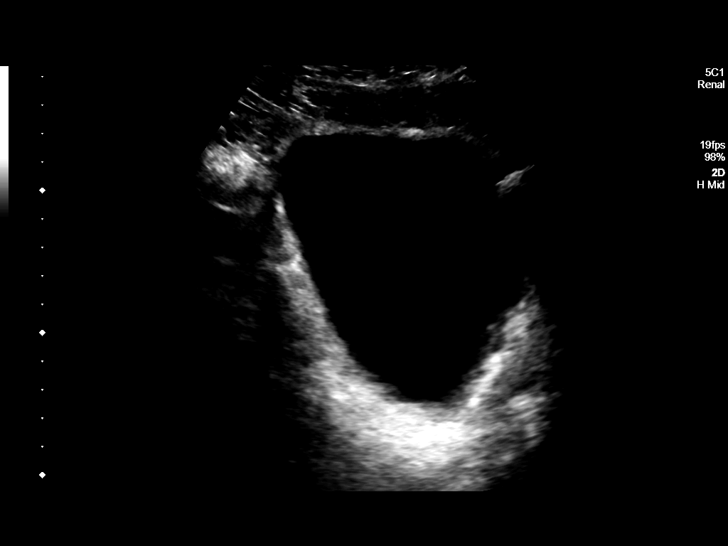

[Series 1001: renal · 1 of 1 slices shown]
[im 1/1]
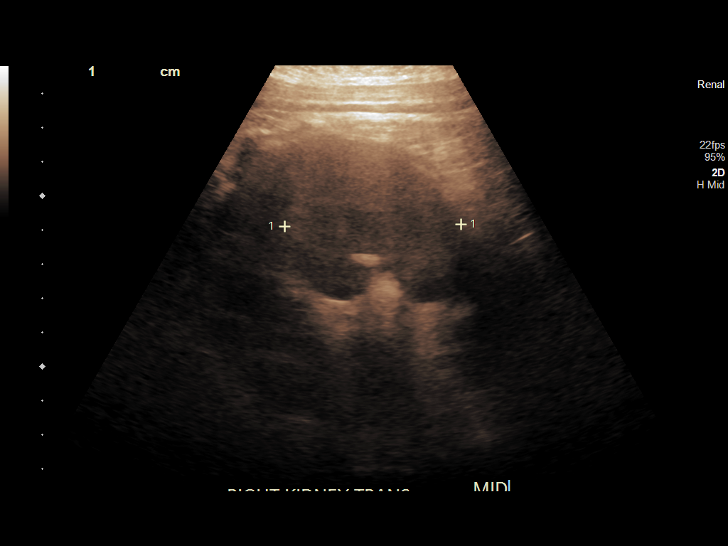

[13 of 25 positions shown; findings below may reference images not displayed]

FINDINGS: Right Kidney:

Renal measurements: 15.3 x 7.9 x 7.6 cm = volume: 480.7 mL.
Echogenicity and renal cortical thickness are within normal limits.
No perinephric fluid or hydronephrosis visualized. There is an area
of increased echogenicity in the mid right kidney measuring 5.2 x
5.2 x 4.8 cm. There is a calculus along the medial aspect of this
apparent mass measuring 1.0 cm. No ureterectasis. Note that there is
somewhat increased vascularity throughout the right kidney. The
vascularity in the masslike area in the mid right kidney is similar
to the remainder of the kidney.

Left Kidney:

Nonvisualized.

Bladder:

Appears normal for degree of bladder distention. Note that there is
flow from the distal right ureter seen in the bladder. No suggestion
of flow from a left kidney seen in the bladder.

Other:

None.
IMPRESSION: 1.  Left kidney not visualized and questionably absent.

2. Suspect a degree of compensatory hypertrophy on the right given
rather large right kidney.

3. Area of increased echogenicity in the mid right kidney laterally
measuring 5.2 x 5.2 x 4.8 cm. This appearance could be due to
infectious etiology or potentially neoplastic etiology. Note that
there is somewhat increased vascularity throughout the right kidney
which suggests potential diffuse infectious bacterial nephritis. The
vascularity of the masslike area in the mid right kidney is similar
to vascularity elsewhere in the right kidney. No perinephric fluid
or hydronephrosis noted. With respect to the sonographic appearance
of the right kidney, correlation with urinalysis is advised. From an
imaging standpoint, renal MR pre and post contrast may be helpful
for further assessment, in particular to assess for potential
infectious versus neoplastic etiology for the masslike area in the
mid right kidney.

4.  1 cm calculus mid right kidney.  No ureterectasis.

## 2022-11-17 IMAGING — CR DG CHEST 2V
1 series · 2 of 2 positions shown · non-contrast
Comparison: 05/02/2018.  03/03/2017.

CLINICAL DATA: Fever.

EXAM:
CHEST - 2 VIEW

[Series 1: dg chest 2 view · 0.14mm/px · 2 of 2 slices shown]
[im 1/2]
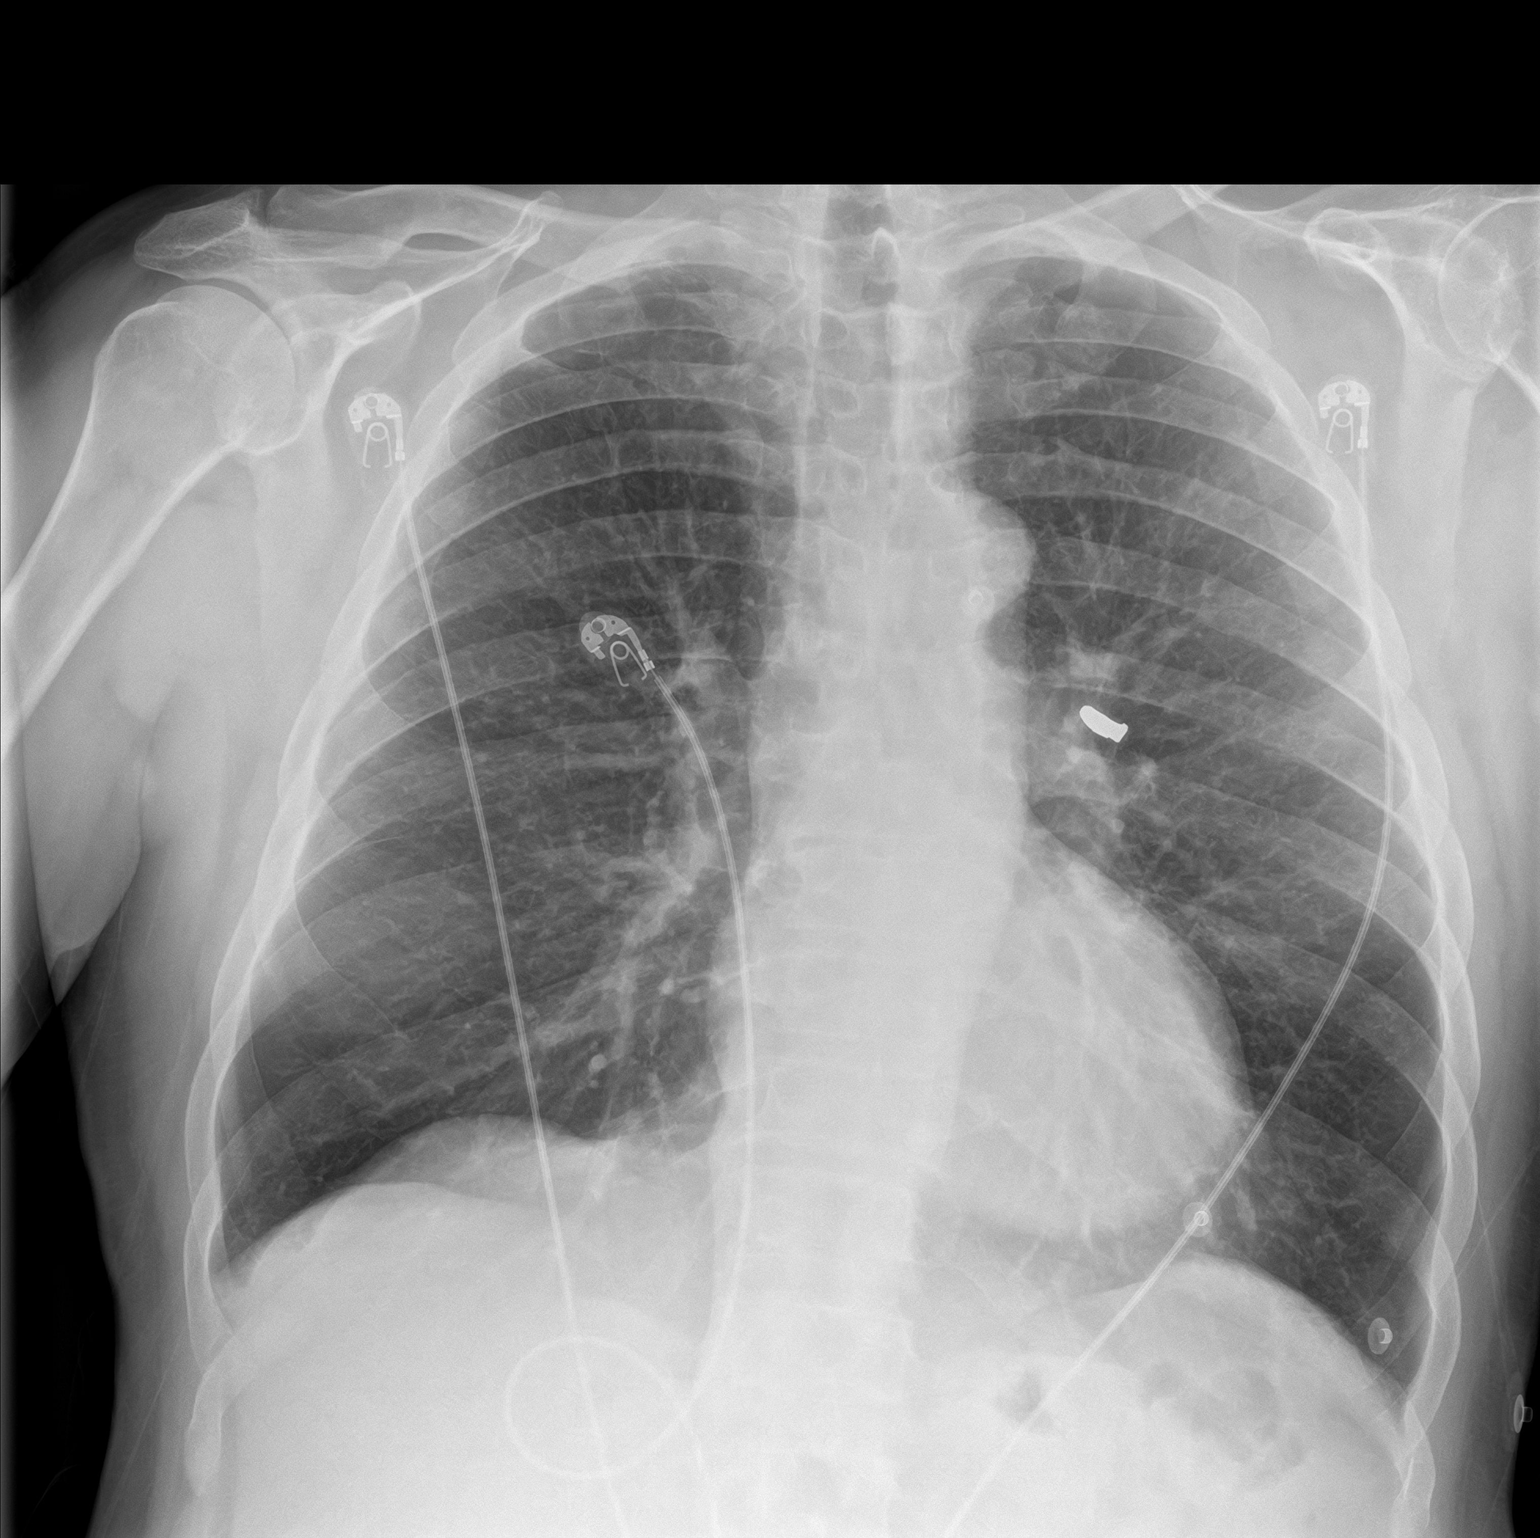
[im 2/2]
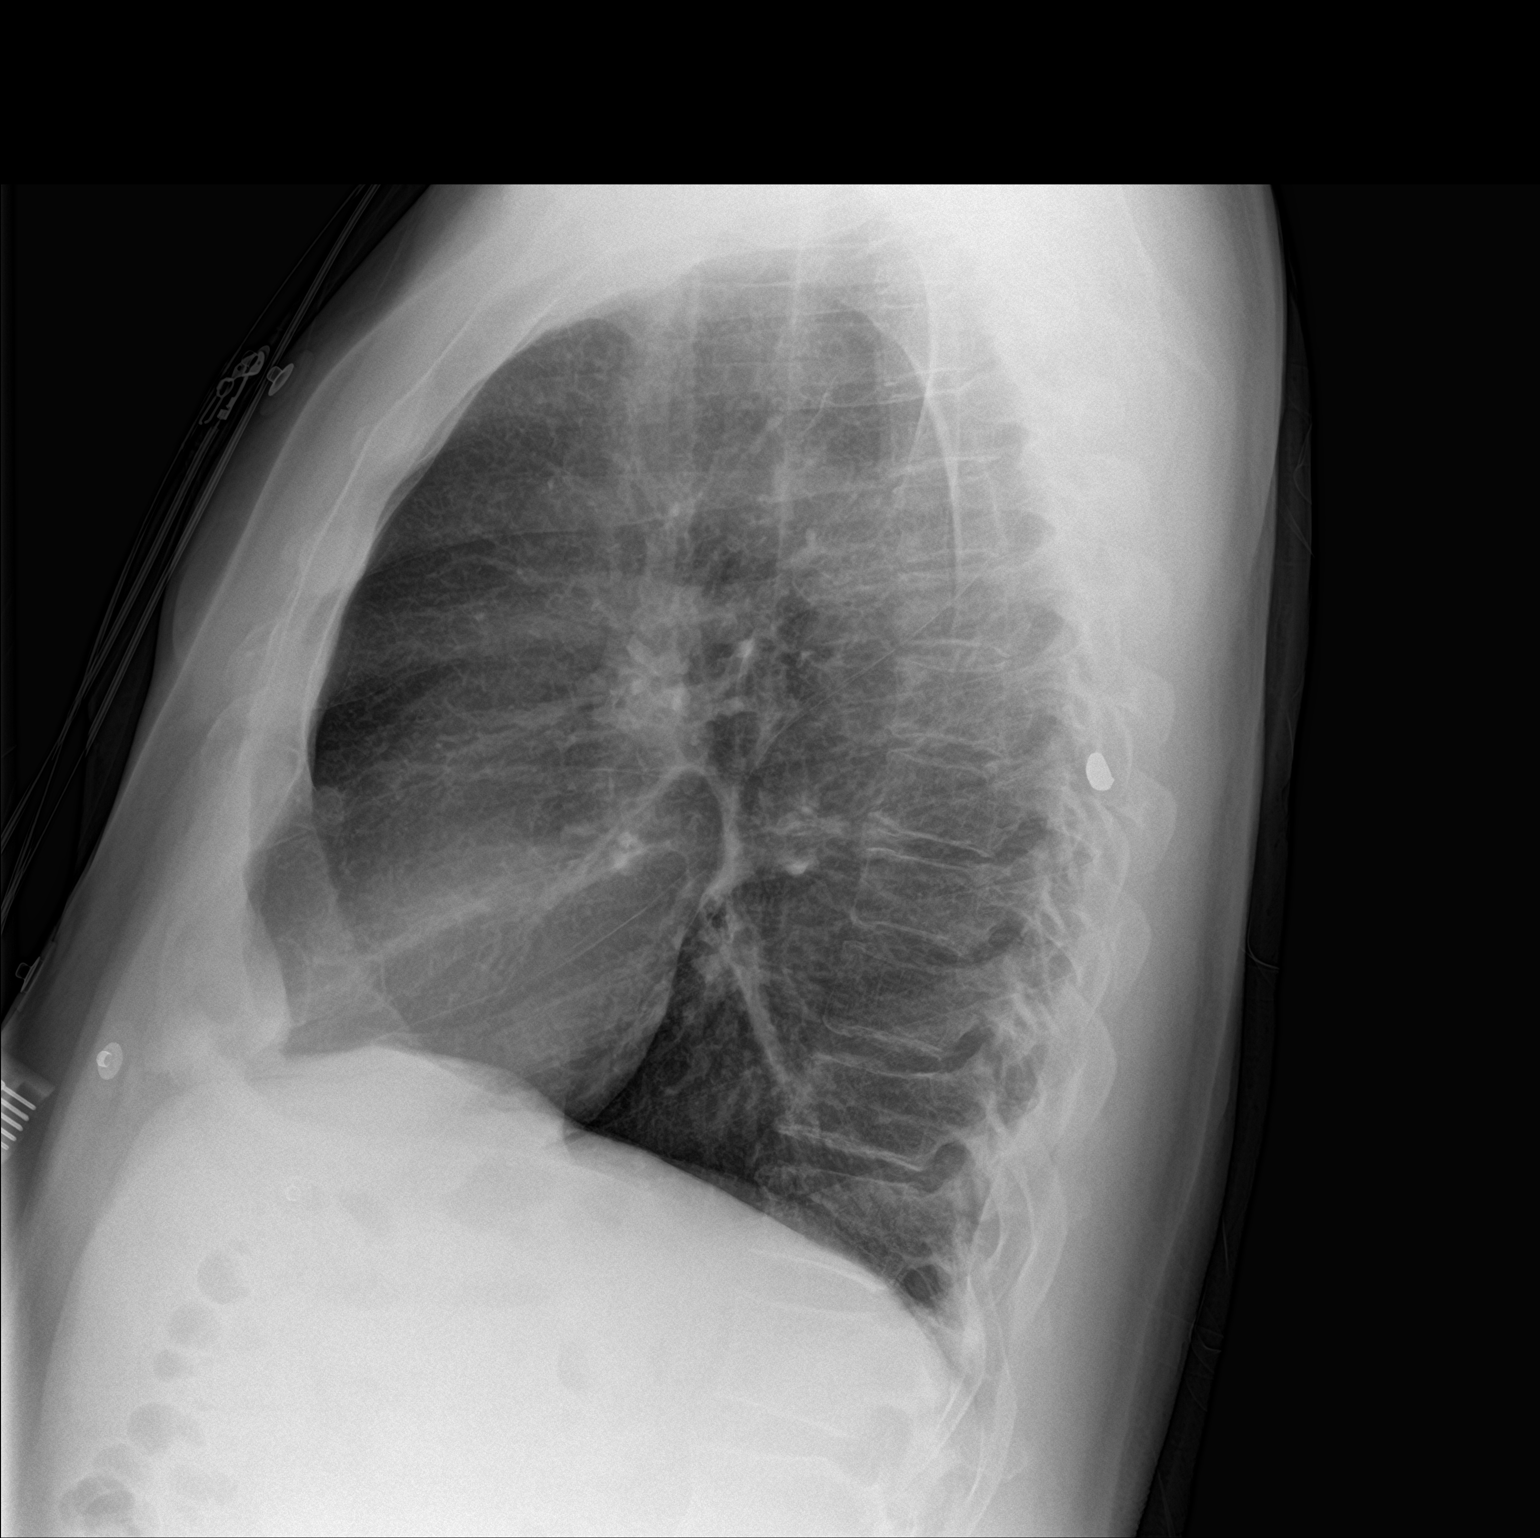

[2 of 2 positions shown; findings below may reference images not displayed]

FINDINGS: Mediastinum and hilar structures normal. Heart size normal.
Questionable nodular opacity noted over the anterior lungs on
lateral view. Nonenhanced chest CT suggested for further evaluation.
No acute infiltrate noted. No pleural effusion or pneumothorax.
Degenerative change thoracic spine. Metallic density again noted
over the left chest in unchanged position.
IMPRESSION: 1. Questionable nodular opacity noted over the anterior lungs on
lateral view. Nonenhanced chest CT suggested for further evaluation.

2.  No acute infiltrate noted.

## 2022-11-17 IMAGING — CT CT CHEST W/O CM
2 of 3 series · 15 of 36 positions shown, 18 images · non-contrast
Comparison: Chest x-ray from same day.

CLINICAL DATA: Fever and chills. Nodular density seen on chest
x-ray.

EXAM:
CT CHEST WITHOUT CONTRAST
TECHNIQUE: Multidetector CT imaging of the chest was performed following the
standard protocol without IV contrast.

[Series 2: thorax · axial · 0.73mm/px · z∈[-355,-51]mm · 12 of 180 slices shown, 15 images]
[im 14/180  mediastinal]
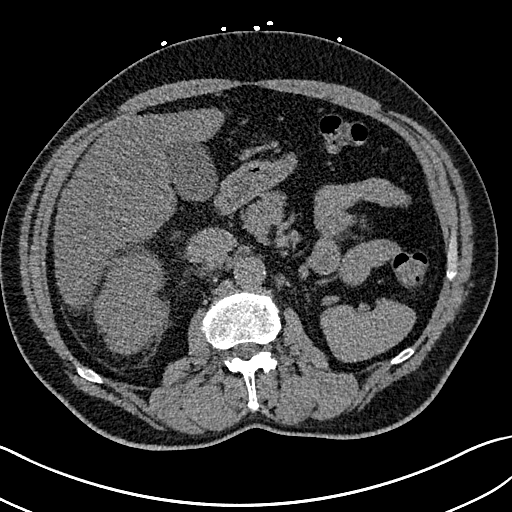
[im 14/180  lung]
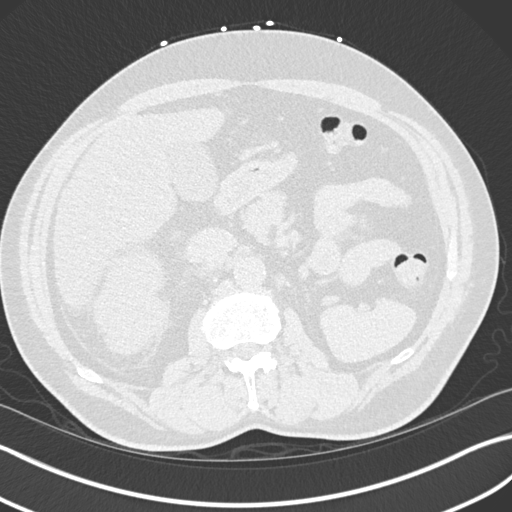
[im 27/180  lung]
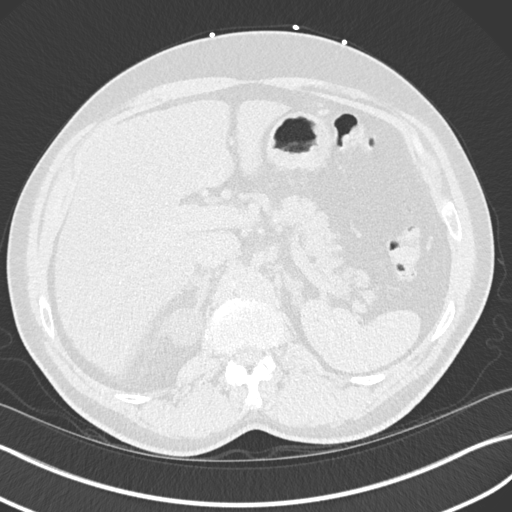
[im 40/180  lung]
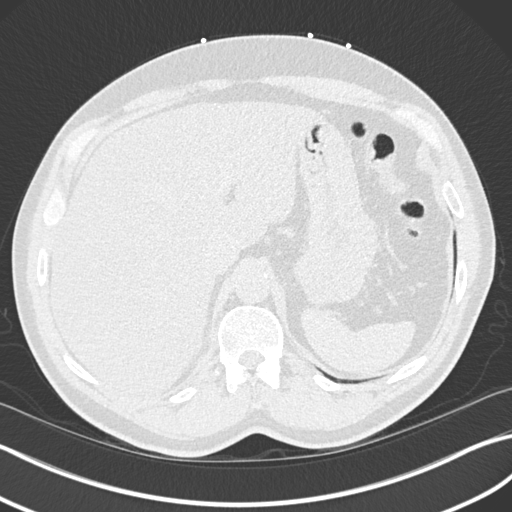
[im 54/180  lung]
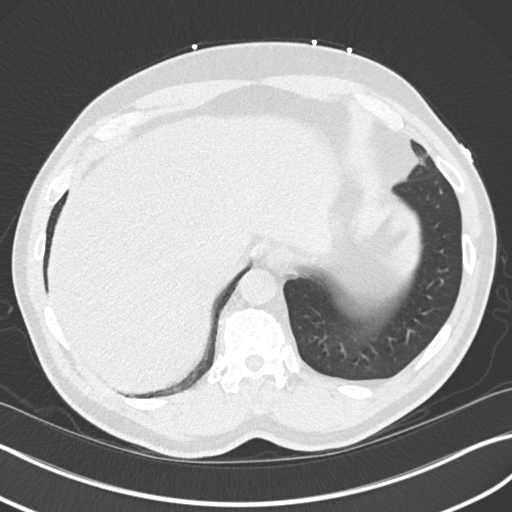
[im 67/180  mediastinal]
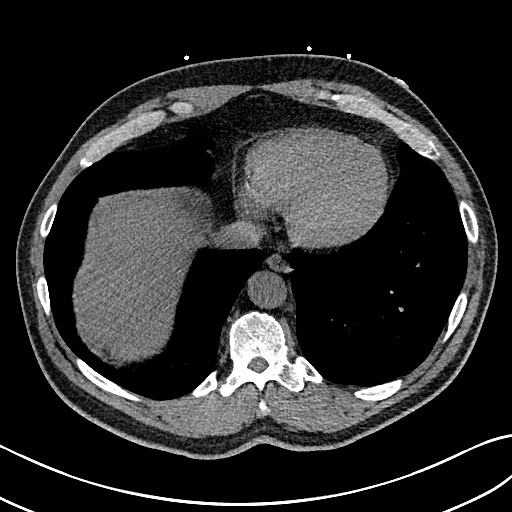
[im 67/180  lung]
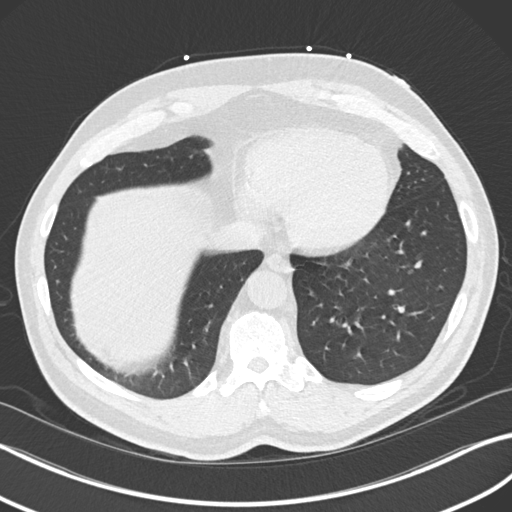
[im 80/180  lung]
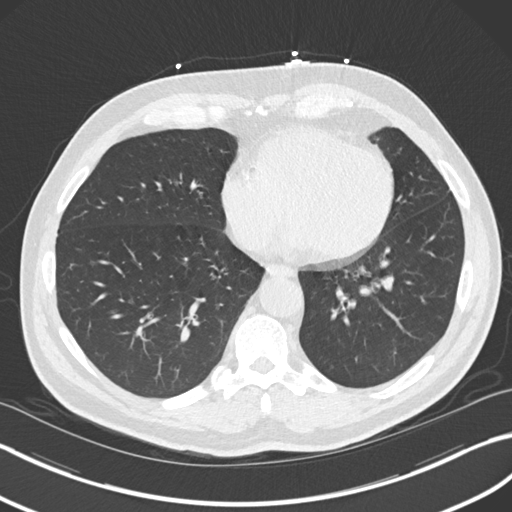
[im 100/180  lung]
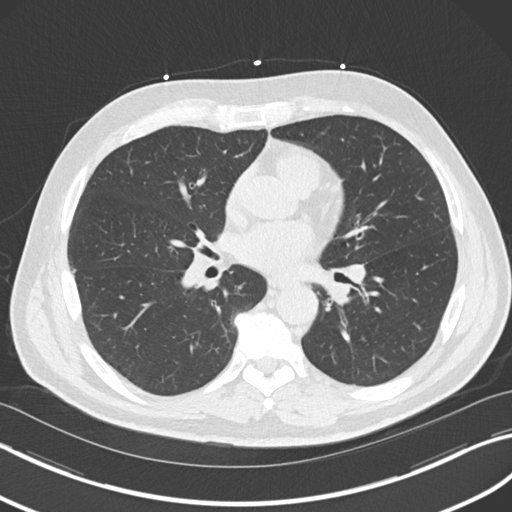
[im 113/180  lung]
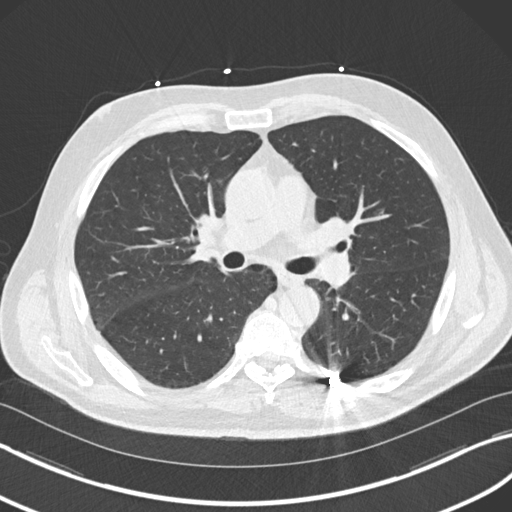
[im 126/180  mediastinal]
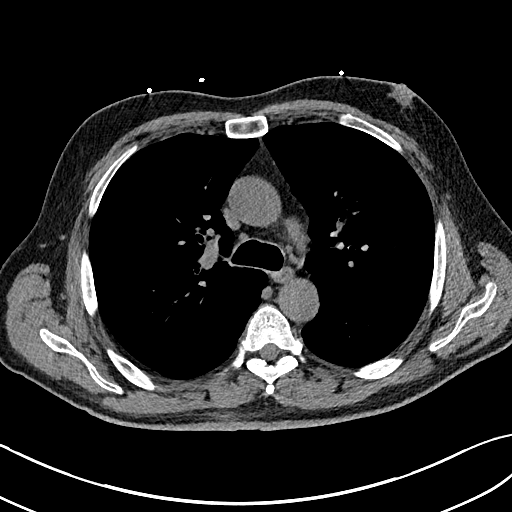
[im 126/180  lung]
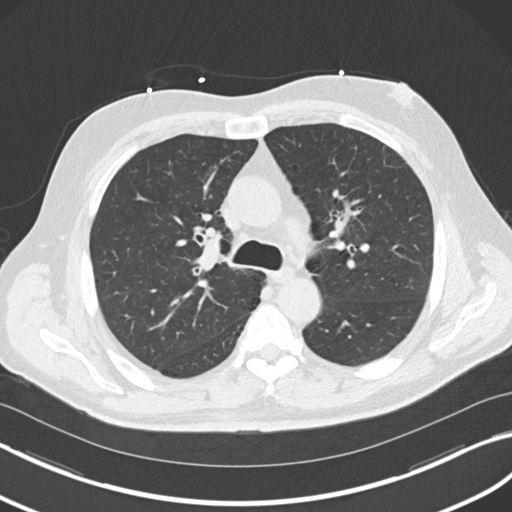
[im 140/180  lung]
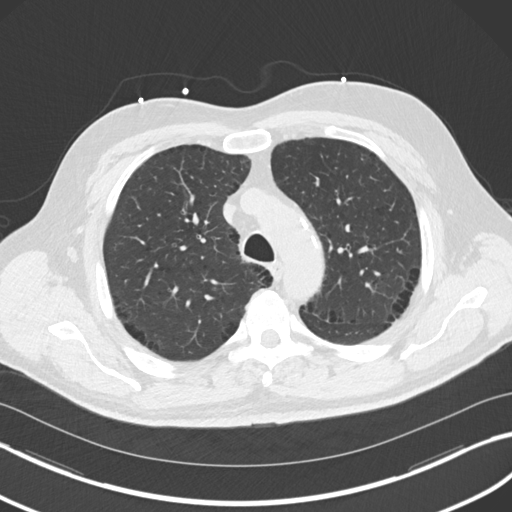
[im 153/180  lung]
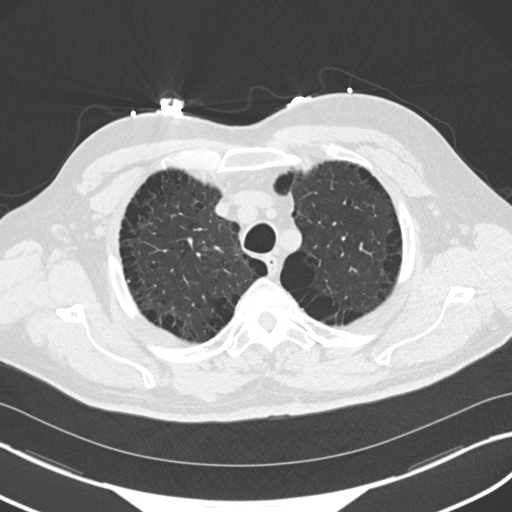
[im 166/180  lung]
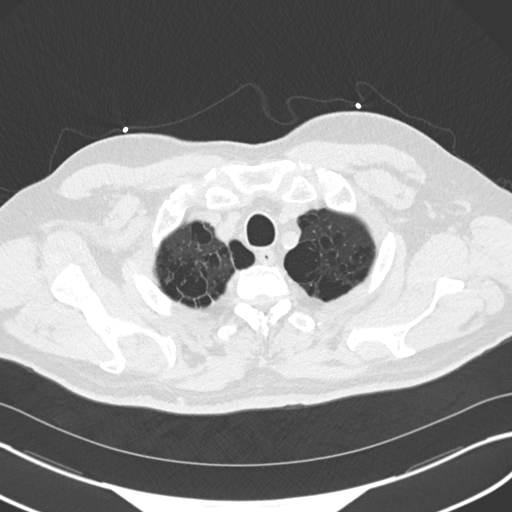

[Series 5: coronal · coronal · 0.72mm/px · 3 of 160 slices shown]
[im 32/160  lung]
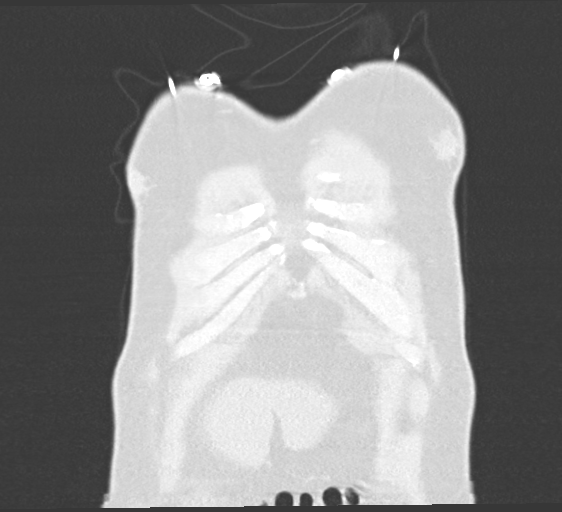
[im 64/160  lung]
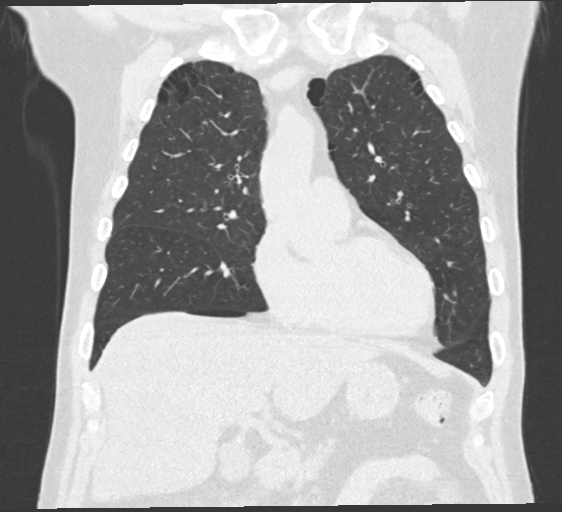
[im 96/160  lung]
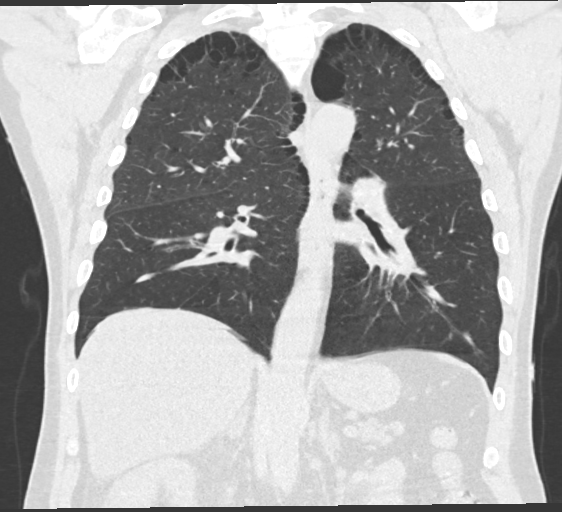

[15 of 36 positions shown; findings below may reference images not displayed]

FINDINGS: Cardiovascular: Normal heart size. No pericardial effusion. No
thoracic aortic aneurysm. Coronary, aortic arch, and branch vessel
atherosclerotic vascular disease.

Mediastinum/Nodes: No enlarged mediastinal or axillary lymph nodes.
Thyroid gland, trachea, and esophagus demonstrate no significant
findings.

Lungs/Pleura: Moderate paraseptal emphysema. Minimal secretions in
the right mainstem bronchus. No CT correlate for the nodular density
seen on chest x-ray. 4 mm nodule in the right upper lobe (series 3,
image 61). No focal consolidation, pleural effusion, or
pneumothorax.

Upper Abdomen: Diffusely decreased hepatic density. Bilateral
low-density adrenal nodules measuring 2.7 cm on the right and 1.8 cm
on the left, consistent with adenomas. Punctate right renal calculi.
Asymmetric, somewhat enlarged right renal parenchyma anteriorly,
incompletely visualized. The superior pole of the left kidney is not
visualized.

Musculoskeletal: Bilateral gynecomastia. No acute or significant
osseous findings. Unchanged intercostal metallic foreign body
between the left seventh and eighth ribs.
IMPRESSION: 1. No CT correlate for the nodular density seen on chest x-ray. No
suspicious pulmonary nodule.
2. 4 mm nodule in the right upper lobe. No follow-up needed if
patient is low-risk. Non-contrast chest CT can be considered in 12
months if patient is high-risk. This recommendation follows the
consensus statement: Guidelines for Management of Incidental
Pulmonary Nodules Detected on CT Images: From the [HOSPITAL]
3. Asymmetric, somewhat enlarged right renal parenchyma anteriorly,
incompletely visualized. Recommend further evaluation with renal
ultrasound to exclude mass lesion. This may represent compensatory
hypertrophy as a left kidney is not identified in its expected
location, though it may be ectopic and below the field of view.
4. Hepatic steatosis.
5. Bilateral adrenal adenomas.
6. Punctate right nephrolithiasis.
7. Aortic Atherosclerosis (972GK-N4A.A) and Emphysema (972GK-TMI.O).

## 2024-02-04 ENCOUNTER — Emergency Department: Payer: Self-pay

## 2024-02-04 ENCOUNTER — Other Ambulatory Visit: Payer: Self-pay

## 2024-02-04 ENCOUNTER — Emergency Department
Admission: EM | Admit: 2024-02-04 | Discharge: 2024-02-04 | Disposition: A | Discharge status: Home / Self Care | Payer: Self-pay | Attending: Emergency Medicine | Admitting: Emergency Medicine

## 2024-02-04 DIAGNOSIS — N200 Calculus of kidney: Secondary | ICD-10-CM

## 2024-02-04 DIAGNOSIS — N132 Hydronephrosis with renal and ureteral calculous obstruction: Secondary | ICD-10-CM | POA: Insufficient documentation

## 2024-02-04 LAB — URINALYSIS, ROUTINE W REFLEX MICROSCOPIC
Bacteria, UA: NONE SEEN
Bilirubin Urine: NEGATIVE
Glucose, UA: NEGATIVE mg/dL
Hgb urine dipstick: NEGATIVE
Ketones, ur: NEGATIVE mg/dL
Nitrite: NEGATIVE
Protein, ur: NEGATIVE mg/dL
Specific Gravity, Urine: 1.017 (ref 1.005–1.030)
Squamous Epithelial / HPF: 0 /HPF (ref 0–5)
pH: 6 (ref 5.0–8.0)

## 2024-02-04 LAB — COMPREHENSIVE METABOLIC PANEL WITH GFR
ALT: 25 U/L (ref 0–44)
AST: 30 U/L (ref 15–41)
Albumin: 3.9 g/dL (ref 3.5–5.0)
Alkaline Phosphatase: 50 U/L (ref 38–126)
Anion gap: 10 (ref 5–15)
BUN: 16 mg/dL (ref 8–23)
CO2: 26 mmol/L (ref 22–32)
Calcium: 9 mg/dL (ref 8.9–10.3)
Chloride: 103 mmol/L (ref 98–111)
Creatinine, Ser: 1.28 mg/dL — ABNORMAL HIGH (ref 0.61–1.24)
GFR, Estimated: >60 mL/min (ref >60)
Glucose, Bld: 108 mg/dL — ABNORMAL HIGH (ref 70–99)
Potassium: 3.6 mmol/L (ref 3.5–5.1)
Sodium: 139 mmol/L (ref 135–145)
Total Bilirubin: 1 mg/dL (ref 0.0–1.2)
Total Protein: 7.9 g/dL (ref 6.5–8.1)

## 2024-02-04 LAB — RESP PANEL BY RT-PCR (RSV, FLU A&B, COVID)  RVPGX2
Influenza A by PCR: NEGATIVE
Influenza B by PCR: NEGATIVE
Resp Syncytial Virus by PCR: NEGATIVE
SARS Coronavirus 2 by RT PCR: NEGATIVE

## 2024-02-04 LAB — CBC
HCT: 47.6 % (ref 39.0–52.0)
Hemoglobin: 15.7 g/dL (ref 13.0–17.0)
MCH: 32 pg (ref 26.0–34.0)
MCHC: 33 g/dL (ref 30.0–36.0)
MCV: 97.1 fL (ref 80.0–100.0)
Platelets: 247 K/uL (ref 150–400)
RBC: 4.9 MIL/uL (ref 4.22–5.81)
RDW: 14.6 % (ref 11.5–15.5)
WBC: 7.8 K/uL (ref 4.0–10.5)
nRBC: 0 % (ref 0.0–0.2)

## 2024-02-04 LAB — LIPASE, BLOOD: Lipase: 36 U/L (ref 11–51)

## 2024-02-04 LAB — TROPONIN I (HIGH SENSITIVITY): Troponin I (High Sensitivity): 9 ng/L (ref <18)

## 2024-02-04 MED ORDER — TAMSULOSIN HCL 0.4 MG PO CAPS: 0.4000 mg | ORAL_CAPSULE | Freq: Every day | ORAL | 0 refills | Status: AC

## 2024-02-04 MED ORDER — ONDANSETRON HCL 4 MG/2ML IJ SOLN
4.0000 mg | Freq: Once | INTRAMUSCULAR | Status: AC
  Administered 2024-02-04: 4 mg via INTRAVENOUS
  Filled 2024-02-04: qty 2

## 2024-02-04 MED ORDER — ONDANSETRON 4 MG PO TBDP: 4.0000 mg | ORAL_TABLET | Freq: Three times a day (TID) | ORAL | 0 refills | Status: AC | PRN

## 2024-02-04 MED ORDER — TAMSULOSIN HCL 0.4 MG PO CAPS
0.4000 mg | ORAL_CAPSULE | Freq: Once | ORAL | Status: AC
  Administered 2024-02-04: 0.4 mg via ORAL
  Filled 2024-02-04: qty 1

## 2024-02-04 MED ORDER — SODIUM CHLORIDE 0.9 % IV BOLUS
1000.0000 mL | Freq: Once | INTRAVENOUS | Status: AC
  Administered 2024-02-04: 1000 mL via INTRAVENOUS

## 2024-02-04 NOTE — Discharge Instructions (Signed)
 We discussed how you have 1 kidney stone and if you develop any fevers, inability to urinate you should return to the ER for repeat evaluation to see if you need to have a stent placed.  Given the stone looks like it is close to the bladder and your symptoms are reassuring without any fevers urology felt that we could try to see if you could pass this without a stent but please return to the ER for change in symptoms and call urology to make a follow-up appointment   Take zofran  to help with nausea. Take Flomax  to help dilate uretha. Call urology number above to schedule outpatient appointment. Return to ED for fevers, unable to keep food down, or any other concerns.

## 2024-02-04 NOTE — ED Triage Notes (Signed)
 Pt comes with c/o fever and vomiting. Pt states this started Friday evening. Pt states no belly pain. Pt states some chills the other day.  Pt states frequent urination.

## 2024-02-04 NOTE — ED Provider Notes (Signed)
 [facility] Provider Note    Event Date/Time   First MD Initiated Contact with Patient 02/04/24 1018     (approximate)   History   Fever and Emesis   HPI  Willie White is a 63 y.o. male with history of solitary kidney with history of obstruction who comes in with concerns for fever, nausea.  Patient reports that he has had a history of infection in it with obstructing kidney stone.  I reviewed the notes where on 02/28/2022 patient underwent a cystoscopy and had a stent placed.  He reports that since that he has had chronic back pain and denies really any worsening pain in his back but he reports having some nausea and feeling like he was hot.  He does not take a temperature given he does not have a thermometer.  He did take Tylenol  but it was last at 2 AM.  He denies any abdominal pain or any other concerns.  He denies any chest pain, shortness of breath     Physical Exam   Triage Vital Signs: ED Triage Vitals [02/04/24 0952]  Encounter Vitals Group     BP (!) 139/90     Girls Systolic BP Percentile      Girls Diastolic BP Percentile      Boys Systolic BP Percentile      Boys Diastolic BP Percentile      Pulse Rate 82     Resp 18     Temp 98.2 F (36.8 C)     Temp src      SpO2 98 %     Weight 200 lb (90.7 kg)     Height 5' 8 (1.727 m)     Head Circumference      Peak Flow      Pain Score 7     Pain Loc      Pain Education      Exclude from Growth Chart     Most recent vital signs: Vitals:   02/04/24 0952  BP: (!) 139/90  Pulse: 82  Resp: 18  Temp: 98.2 F (36.8 C)  SpO2: 98%     General: Awake, no distress.  CV:  Good peripheral perfusion.  Resp:  Normal effort.  Abd:  No distention.  Soft and nontender Other:  No swelling   ED Results / Procedures / Treatments   Labs (all labs ordered are listed, but only abnormal results are displayed) Labs Reviewed  COMPREHENSIVE METABOLIC PANEL WITH GFR - Abnormal; Notable for the  following components:      Result Value   Glucose, Bld 108 (*)    Creatinine, Ser 1.28 (*)    All other components within normal limits  RESP PANEL BY RT-PCR (RSV, FLU A&B, COVID)  RVPGX2  CBC  LIPASE, BLOOD  URINALYSIS, ROUTINE W REFLEX MICROSCOPIC      RADIOLOGY I have reviewed the ct personally and interpreted + kidney stone on R    EKG my interpretation is sinus bradycardia rate of 59 without any ST elevation or T wave inversions, normal intervals  PROCEDURES:  Critical Care performed: No  Procedures   MEDICATIONS ORDERED IN ED: Medications - No data to display   IMPRESSION / MDM / ASSESSMENT AND PLAN / ED COURSE  I reviewed the triage vital signs and the nursing notes.   Patient's presentation is most consistent with acute presentation with potential threat to life or bodily function.  Patient comes in with concerns for fevers,  nausea, back pain although does report that the pain in his back is similar to prior.  Given his history of fever secondary to infected kidney stones we will get CT renal, x-ray to make sure no pneumonia get urine, blood work to further evaluate.  UA is without evidence of UTI does have 6-10 WBCs but otherwise it is negative.  COVID and flu are negative.  CBC is reassuring lipase is normal creatinine is 1.28 which is around patient's baseline. Trop negative and no CP/sob   IMPRESSION:  1. 3 x 4 x 5 mm stone in the right UVJ without overt right-sided  hydroureteronephrosis. There is some subtle right periureteric edema  and mild right perinephric edema.    12:17 PM discussed with Dr. Selma from urology.  Given there is no evidence of urinary retention, creatinine at baseline, is not obstructed and it appears that it is about to pass through the bladder he do not feel like emergent stent placement tonight was necessary.  Did not feel like patient needed any antibiotics and can follow-up outpatient or return if he develops any of the  symptoms.  I discussed with patient pros and cons of stent placement and he to would like to try to avoid it.  He understands that he should return for fevers.  I did recheck a temperature here and he was still 98 and it has been 10 hours since Tylenol  use.  We discussed Flomax , Zofran  for symptomatic management and we discussed pain medication such as oxycodone  but patient declined.  He understands that if he starts to have a documented fever which he can go get a thermometer for or return here for a temperature check if he starts feeling hot, difficulties urinating or worsening symptoms then he will need to return to see if things are worsening.  He expressed understanding otherwise he understands importance of following up with urology outpatient.  The patient is on the cardiac monitor to evaluate for evidence of arrhythmia and/or significant heart rate changes.      FINAL CLINICAL IMPRESSION(S) / ED DIAGNOSES   Final diagnoses:  Kidney stone     Rx / DC Orders   ED Discharge Orders          Ordered    tamsulosin  (FLOMAX ) 0.4 MG CAPS capsule  Daily        02/04/24 1235    ondansetron  (ZOFRAN -ODT) 4 MG disintegrating tablet  Every 8 hours PRN        02/04/24 1235             Note:  This document was prepared using Dragon voice recognition software and may include unintentional dictation errors.   Ernest Ronal BRAVO, MD 02/04/24 [phone]
